# Patient Record
Sex: Female | Born: 1965 | Race: White | Hispanic: No | Marital: Married | Smoking: Never smoker
Health system: Southern US, Community
[De-identification: ages and names within clinical notes are randomized; demographics above are authoritative.]

## PROBLEM LIST (undated history)

## (undated) DIAGNOSIS — Q0700 Arnold-Chiari syndrome without spina bifida or hydrocephalus: Secondary | ICD-10-CM

## (undated) DIAGNOSIS — R51 Headache: Secondary | ICD-10-CM

## (undated) DIAGNOSIS — G4700 Insomnia, unspecified: Secondary | ICD-10-CM

## (undated) DIAGNOSIS — F419 Anxiety disorder, unspecified: Secondary | ICD-10-CM

## (undated) DIAGNOSIS — K219 Gastro-esophageal reflux disease without esophagitis: Secondary | ICD-10-CM

## (undated) HISTORY — DX: Insomnia, unspecified: G47.00

---

## 2005-10-30 ENCOUNTER — Ambulatory Visit (HOSPITAL_COMMUNITY): Admission: RE | Admit: 2005-10-30 | Discharge: 2005-10-30 | Payer: Self-pay | Admitting: Otolaryngology

## 2007-12-30 ENCOUNTER — Other Ambulatory Visit: Admission: RE | Admit: 2007-12-30 | Discharge: 2007-12-30 | Payer: Self-pay | Admitting: Obstetrics and Gynecology

## 2009-05-27 ENCOUNTER — Other Ambulatory Visit: Admission: RE | Admit: 2009-05-27 | Discharge: 2009-05-27 | Payer: Self-pay | Admitting: Obstetrics and Gynecology

## 2010-01-27 ENCOUNTER — Ambulatory Visit (HOSPITAL_COMMUNITY): Admission: RE | Admit: 2010-01-27 | Discharge: 2010-01-27 | Payer: Self-pay | Admitting: Obstetrics and Gynecology

## 2010-07-17 ENCOUNTER — Encounter: Payer: Self-pay | Admitting: Obstetrics and Gynecology

## 2011-05-11 ENCOUNTER — Other Ambulatory Visit: Payer: Self-pay | Admitting: Family Medicine

## 2011-05-11 DIAGNOSIS — R52 Pain, unspecified: Secondary | ICD-10-CM

## 2011-05-16 ENCOUNTER — Ambulatory Visit (HOSPITAL_COMMUNITY)
Admission: RE | Admit: 2011-05-16 | Discharge: 2011-05-16 | Disposition: A | Discharge status: Home / Self Care | Payer: BC Managed Care – PPO | Source: Ambulatory Visit | Attending: Family Medicine | Admitting: Family Medicine

## 2011-05-16 DIAGNOSIS — K8020 Calculus of gallbladder without cholecystitis without obstruction: Secondary | ICD-10-CM | POA: Insufficient documentation

## 2011-05-16 DIAGNOSIS — R1011 Right upper quadrant pain: Secondary | ICD-10-CM | POA: Insufficient documentation

## 2011-05-16 DIAGNOSIS — R52 Pain, unspecified: Secondary | ICD-10-CM

## 2011-06-22 ENCOUNTER — Encounter (HOSPITAL_COMMUNITY): Payer: Self-pay | Admitting: Pharmacy Technician

## 2011-06-28 ENCOUNTER — Other Ambulatory Visit: Payer: Self-pay | Admitting: General Surgery

## 2011-06-29 NOTE — Patient Instructions (Addendum)
20 Baleigh B Alligood  06/29/2011   Your procedure is scheduled on:  07/03/11  Report to [facility] at 9:00 AM.  Call this number if you have problems the morning of surgery: [phone]   Remember:   Do not eat food:After Midnight.  May have clear liquids:until Midnight .  Clear liquids include soda, tea, black coffee, apple or grape juice, broth.  Take these medicines the morning of surgery with A SIP OF WATER: Protonix. Take your Alprazolam and Fioricet only if needed.   Do not wear jewelry, make-up or nail polish.  Do not wear lotions, powders, or perfumes. You may wear deodorant.  Do not shave 48 hours prior to surgery.  Do not bring valuables to the hospital.  Contacts, dentures or bridgework may not be worn into surgery.  Leave suitcase in the car. After surgery it may be brought to your room.  For patients admitted to the hospital, checkout time is 11:00 AM the day of discharge.   Patients discharged the day of surgery will not be allowed to drive home.  Name and phone number of your driver:   Special Instructions: CHG Shower Use Special Wash: 1/2 bottle night before surgery and 1/2 bottle morning of surgery.   Please read over the following fact sheets that you were given: Pain Booklet, MRSA Information, Surgical Site Infection Prevention, Anesthesia Post-op Instructions and Care and Recovery After Surgery    Laparoscopic Cholecystectomy Laparoscopic cholecystectomy is surgery to remove the gallbladder. The gallbladder is located slightly to the right of center in the abdomen, behind the liver. It is a concentrating and storage sac for the bile produced in the liver. Bile aids in the digestion and absorption of fats. Gallbladder disease (cholecystitis) is an inflammation of your gallbladder. This condition is usually caused by a buildup of gallstones (cholelithiasis) in your gallbladder. Gallstones can block the flow of bile, resulting in inflammation and pain. In severe cases, emergency  surgery may be required. When emergency surgery is not required, you will have time to prepare for the procedure. Laparoscopic surgery is an alternative to open surgery. Laparoscopic surgery usually has a shorter recovery time. Your common bile duct may also need to be examined and explored. Your caregiver will discuss this with you if he or she feels this should be done. If stones are found in the common bile duct, they may be removed. LET YOUR CAREGIVER KNOW ABOUT:  Allergies to food or medicine.   Medicines taken, including vitamins, herbs, eyedrops, over-the-counter medicines, and creams.   Use of steroids (by mouth or creams).   Previous problems with anesthetics or numbing medicines.   History of bleeding problems or blood clots.   Previous surgery.   Other health problems, including diabetes and kidney problems.   Possibility of pregnancy, if this applies.  RISKS AND COMPLICATIONS All surgery is associated with risks. Some problems that may occur following this procedure include:  Infection.   Damage to the common bile duct, nerves, arteries, veins, or other internal organs such as the stomach or intestines.   Bleeding.   A stone may remain in the common bile duct.  BEFORE THE PROCEDURE  Do not take aspirin for 3 days prior to surgery or blood thinners for 1 week prior to surgery.   Do not eat or drink anything after midnight the night before surgery.   Let your caregiver know if you develop a cold or other infectious problem prior to surgery.   You should be present  60 minutes before the procedure or as directed.  PROCEDURE  You will be given medicine that makes you sleep (general anesthetic). When you are asleep, your surgeon will make several small cuts (incisions) in your abdomen. One of these incisions is used to insert a small, lighted scope (laparoscope) into the abdomen. The laparoscope helps the surgeon see into your abdomen. Carbon dioxide gas will be pumped  into your abdomen. The gas allows more room for the surgeon to perform your surgery. Other operating instruments are inserted through the other incisions. Laparoscopic procedures may not be appropriate when:  There is major scarring from previous surgery.   The gallbladder is extremely inflamed.   There are bleeding disorders or unexpected cirrhosis of the liver.   A pregnancy is near term.   Other conditions make the laparoscopic procedure impossible.  If your surgeon feels it is not safe to continue with a laparoscopic procedure, he or she will perform an open abdominal procedure. In this case, the surgeon will make an incision to open the abdomen. This gives the surgeon a larger view and field to work within. This may allow the surgeon to perform procedures that sometimes cannot be performed with a laparoscope alone. Open surgery has a longer recovery time. AFTER THE PROCEDURE  You will be taken to the recovery area where a nurse will watch and check your progress.   You may be allowed to go home the same day.   Do not resume physical activities until directed by your caregiver.   You may resume a normal diet and activities as directed.  Document Released: 06/12/2005 Document Revised: 02/22/2011 Document Reviewed: 11/25/2010 [facility] Patient Information 2012 [place], [place].

## 2011-06-30 ENCOUNTER — Encounter (HOSPITAL_COMMUNITY)
Admission: RE | Admit: 2011-06-30 | Discharge: 2011-06-30 | Disposition: A | Discharge status: Home / Self Care | Payer: Managed Care, Other (non HMO) | Source: Ambulatory Visit | Attending: General Surgery | Admitting: General Surgery

## 2011-06-30 ENCOUNTER — Encounter (HOSPITAL_COMMUNITY): Payer: Self-pay

## 2011-06-30 HISTORY — DX: Anxiety disorder, unspecified: F41.9

## 2011-06-30 HISTORY — DX: Gastro-esophageal reflux disease without esophagitis: K21.9

## 2011-06-30 HISTORY — DX: Headache: R51

## 2011-06-30 HISTORY — DX: Arnold-Chiari syndrome without spina bifida or hydrocephalus: Q07.00

## 2011-06-30 LAB — COMPREHENSIVE METABOLIC PANEL
ALT: 15 U/L (ref 0–35)
AST: 15 U/L (ref 0–37)
Albumin: 3.9 g/dL (ref 3.5–5.2)
CO2: 29 mEq/L (ref 19–32)
Glucose, Bld: 111 mg/dL — ABNORMAL HIGH (ref 70–99)
Total Bilirubin: 0.2 mg/dL — ABNORMAL LOW (ref 0.3–1.2)

## 2011-06-30 LAB — SURGICAL PCR SCREEN
MRSA, PCR: NEGATIVE
Staphylococcus aureus: POSITIVE — AB

## 2011-06-30 LAB — CBC
Hemoglobin: 12 g/dL (ref 12.0–15.0)
MCH: 27.4 pg (ref 26.0–34.0)
MCV: 86.5 fL (ref 78.0–100.0)
Platelets: 389 10*3/uL (ref 150–400)
RBC: 4.38 MIL/uL (ref 3.87–5.11)
RDW: 14.2 % (ref 11.5–15.5)

## 2011-06-30 LAB — DIFFERENTIAL
Eosinophils Absolute: 0.1 10*3/uL (ref 0.0–0.7)
Monocytes Absolute: 0.6 10*3/uL (ref 0.1–1.0)
Neutrophils Relative %: 68 % (ref 43–77)

## 2011-06-30 LAB — HCG, SERUM, QUALITATIVE: Preg, Serum: NEGATIVE

## 2011-06-30 NOTE — Consult Note (Signed)
NAME:  Martha Crawford, Martha Crawford NO.:  [id number]  MEDICAL RECORD NO.:  [id number]  LOCATION:  DOIB                          FACILITY:  [facility]  PHYSICIAN:  Barbaraann Barthel, M.D. DATE OF BIRTH:  1965-12-23  DATE OF CONSULTATION:  06/30/2011 DATE OF DISCHARGE:                                CONSULTATION   REFERRING PHYSICIAN:  Donna Bernard, MD  ADMITTING DIAGNOSES:  Cholelithiasis and cholecystitis.  Note, this is a 46 year old white female who had recurrent symptoms of postprandial right upper quadrant pain radiating to her back over the last 2 years.  She did not have significant nausea or vomiting; however, this was a recurrent GI problem that she had along with her symptoms of GERD from time to time.  She had a recent sonogram, which revealed cholelithiasis with echogenic material approximately 3 cm in diameter noted.  She was self-referred from a Dr. Lorin Picket A. Luking's office.  PAST MEDICAL HISTORY:  Fairly unremarkable.  She has a history of anxiety and GERD.  For medications, check the medication chart.  She has no known allergies.  She has no history of tobacco or alcohol use.  PHYSICAL EXAMINATION:  VITAL SIGNS:  She is 5 feet 2 inches, weighs 170 pounds.  Her temperature is 98.2, her heart rate is 64 per minute, respirations 12 per minute, blood pressure 120/80. HEENT:  Head is normocephalic.  Eyes, extraocular movements are intact. Pupils were round and reactive to light and accommodation.  There is no conjunctival pallor or scleral injection.  There is no scleral icterus noted.  Nose and oral mucosa are moist. NECK:  Supple and cylindrical without jugular vein distention, thyromegaly, or tracheal deviation.  There is no adenopathy, and no bruits appreciated. CHEST:  Clear, both anterior and posterior auscultation. HEART:  Regular rhythm. BREASTS AND AXILLA:  Without masses. ABDOMEN:  Soft.  The patient has no localizing tenderness at  the present. There are no masses, and no hernias appreciated. RECTAL:  The patient refused rectal exam at this time. EXTREMITIES:  Within normal limits.  REVIEW OF SYSTEMS:  OB/GYN:  She is a gravida 1, para 1, abortus 0, cesarean 0 female.  She has no family history of breast carcinoma.  Her last mammogram was reported by her as being negative.  This was done in 2012.  GI:  No past history of hepatitis, constipation, diarrhea, bright red rectal bleeding or melena, or history of inflammatory bowel disease or irritable bowel syndrome, and no unexplained weight loss.  The patient does have some symptoms of GERD from time to time.  GU:  No history of dysuria or kidney stones or frequency.  NEURO:  No history of migraines or seizures.  She has been diagnosed with Arnold-Chiari. ENDOCRINE:  No history of endocrine system problems of diabetes or thyroid.  CARDIOPULMONARY:  Within normal limits.  MUSCULOSKELETAL:  The patient is overweight.  PLAN:  We will plan for surgery via the outpatient department.  She has been given a restrictive diet.  We will do this on an elective basis.  I discussed the complications of laparoscopic cholecystectomy with her in detail discussing complications not limited to, but including bleeding,  infection, damage to bile ducts, perforation of organs, and transitory diarrhea, and the possibility of open surgery might be required. Informed consent was obtained.     Barbaraann Barthel, M.D.     WB/MEDQ  D:  06/30/2011  T:  06/30/2011  Job:  [id number]

## 2011-07-03 ENCOUNTER — Ambulatory Visit (HOSPITAL_COMMUNITY)
Admission: RE | Admit: 2011-07-03 | Discharge: 2011-07-04 | Disposition: A | Discharge status: Home / Self Care | Payer: Managed Care, Other (non HMO) | Source: Ambulatory Visit | Attending: General Surgery | Admitting: General Surgery

## 2011-07-03 ENCOUNTER — Encounter (HOSPITAL_COMMUNITY): Payer: Self-pay | Admitting: Anesthesiology

## 2011-07-03 ENCOUNTER — Encounter (HOSPITAL_COMMUNITY)
Admission: RE | Disposition: A | Discharge status: Home / Self Care | Payer: Self-pay | Source: Ambulatory Visit | Attending: General Surgery

## 2011-07-03 ENCOUNTER — Encounter (HOSPITAL_COMMUNITY): Payer: Self-pay | Admitting: *Deleted

## 2011-07-03 ENCOUNTER — Ambulatory Visit (HOSPITAL_COMMUNITY): Payer: Managed Care, Other (non HMO) | Admitting: Anesthesiology

## 2011-07-03 ENCOUNTER — Other Ambulatory Visit: Payer: Self-pay | Admitting: General Surgery

## 2011-07-03 DIAGNOSIS — F411 Generalized anxiety disorder: Secondary | ICD-10-CM | POA: Insufficient documentation

## 2011-07-03 DIAGNOSIS — K8010 Calculus of gallbladder with chronic cholecystitis without obstruction: Secondary | ICD-10-CM

## 2011-07-03 DIAGNOSIS — K8000 Calculus of gallbladder with acute cholecystitis without obstruction: Secondary | ICD-10-CM | POA: Insufficient documentation

## 2011-07-03 HISTORY — PX: CHOLECYSTECTOMY: SHX55

## 2011-07-03 SURGERY — LAPAROSCOPIC CHOLECYSTECTOMY
Anesthesia: General | Site: Abdomen | Wound class: Contaminated

## 2011-07-03 MED ORDER — ACETAMINOPHEN 325 MG PO TABS: 650.0000 mg | ORAL_TABLET | ORAL | Status: DC | PRN

## 2011-07-03 MED ORDER — DEXAMETHASONE SODIUM PHOSPHATE 4 MG/ML IJ SOLN
INTRAMUSCULAR | Status: AC
  Administered 2011-07-03: 4 mg via INTRAVENOUS
  Filled 2011-07-03: qty 1

## 2011-07-03 MED ORDER — PANTOPRAZOLE SODIUM 40 MG PO TBEC
40.0000 mg | DELAYED_RELEASE_TABLET | Freq: Every day | ORAL | Status: DC
  Administered 2011-07-04: 40 mg via ORAL
  Filled 2011-07-03 (×3): qty 1

## 2011-07-03 MED ORDER — FENTANYL CITRATE 0.05 MG/ML IJ SOLN
INTRAMUSCULAR | Status: AC
  Administered 2011-07-03: 50 ug via INTRAVENOUS
  Filled 2011-07-03: qty 2

## 2011-07-03 MED ORDER — ONDANSETRON HCL 4 MG/2ML IJ SOLN
INTRAMUSCULAR | Status: AC
  Administered 2011-07-03: 4 mg via INTRAVENOUS
  Filled 2011-07-03: qty 2

## 2011-07-03 MED ORDER — MORPHINE SULFATE 2 MG/ML IJ SOLN
1.0000 mg | INTRAMUSCULAR | Status: DC | PRN
  Administered 2011-07-03 – 2011-07-04 (×5): 1 mg via INTRAVENOUS
  Filled 2011-07-03 (×4): qty 1

## 2011-07-03 MED ORDER — DEXTROSE 5 % IV SOLN: 1.0000 g | Freq: Once | INTRAVENOUS | Status: DC

## 2011-07-03 MED ORDER — ROCURONIUM BROMIDE 50 MG/5ML IV SOLN
INTRAVENOUS | Status: AC
  Filled 2011-07-03: qty 1

## 2011-07-03 MED ORDER — DEXTROSE 5 % IV SOLN
1.0000 g | INTRAVENOUS | Status: DC | PRN
  Administered 2011-07-03: 1 g via INTRAVENOUS

## 2011-07-03 MED ORDER — GLYCOPYRROLATE 0.2 MG/ML IJ SOLN
INTRAMUSCULAR | Status: DC | PRN
  Administered 2011-07-03: .4 mg via INTRAVENOUS

## 2011-07-03 MED ORDER — FENTANYL CITRATE 0.05 MG/ML IJ SOLN
25.0000 ug | INTRAMUSCULAR | Status: DC | PRN
  Administered 2011-07-03 (×4): 50 ug via INTRAVENOUS

## 2011-07-03 MED ORDER — MIDAZOLAM HCL 5 MG/5ML IJ SOLN
INTRAMUSCULAR | Status: DC | PRN
  Administered 2011-07-03: 2 mg via INTRAVENOUS

## 2011-07-03 MED ORDER — ONDANSETRON HCL 4 MG/2ML IJ SOLN
4.0000 mg | Freq: Once | INTRAMUSCULAR | Status: AC
  Administered 2011-07-03: 4 mg via INTRAVENOUS

## 2011-07-03 MED ORDER — GLYCOPYRROLATE 0.2 MG/ML IJ SOLN
INTRAMUSCULAR | Status: AC
  Filled 2011-07-03: qty 2

## 2011-07-03 MED ORDER — ALPRAZOLAM 0.5 MG PO TABS
0.5000 mg | ORAL_TABLET | Freq: Every day | ORAL | Status: DC | PRN
  Administered 2011-07-03 – 2011-07-04 (×2): 0.5 mg via ORAL
  Filled 2011-07-03: qty 1

## 2011-07-03 MED ORDER — MORPHINE SULFATE 4 MG/ML IJ SOLN
INTRAMUSCULAR | Status: AC
  Filled 2011-07-03: qty 1

## 2011-07-03 MED ORDER — MIDAZOLAM HCL 2 MG/2ML IJ SOLN
INTRAMUSCULAR | Status: AC
  Filled 2011-07-03: qty 2

## 2011-07-03 MED ORDER — WATER FOR IRRIGATION, STERILE IR SOLN
Status: DC | PRN
  Administered 2011-07-03: 2000 mL

## 2011-07-03 MED ORDER — DEXAMETHASONE SODIUM PHOSPHATE 4 MG/ML IJ SOLN
4.0000 mg | Freq: Once | INTRAMUSCULAR | Status: AC
  Administered 2011-07-03: 4 mg via INTRAVENOUS

## 2011-07-03 MED ORDER — MIDAZOLAM HCL 2 MG/2ML IJ SOLN
INTRAMUSCULAR | Status: AC
  Administered 2011-07-03: 2 mg via INTRAVENOUS
  Filled 2011-07-03: qty 2

## 2011-07-03 MED ORDER — BUPIVACAINE HCL (PF) 0.5 % IJ SOLN
INTRAMUSCULAR | Status: DC | PRN
  Administered 2011-07-03: 10 mL
  Administered 2011-07-03: 3 mL

## 2011-07-03 MED ORDER — POTASSIUM CHLORIDE IN NACL 20-0.9 MEQ/L-% IV SOLN
INTRAVENOUS | Status: DC
  Administered 2011-07-03: 125 mL/h via INTRAVENOUS
  Administered 2011-07-04 (×2): via INTRAVENOUS
  Filled 2011-07-03 (×6): qty 1000

## 2011-07-03 MED ORDER — SCOPOLAMINE 1 MG/3DAYS TD PT72
1.0000 | MEDICATED_PATCH | Freq: Once | TRANSDERMAL | Status: DC
  Administered 2011-07-03: 1.5 mg via TRANSDERMAL

## 2011-07-03 MED ORDER — PROPOFOL 10 MG/ML IV BOLUS
INTRAVENOUS | Status: DC | PRN
  Administered 2011-07-03: 150 mg via INTRAVENOUS

## 2011-07-03 MED ORDER — ONDANSETRON HCL 4 MG/2ML IJ SOLN
4.0000 mg | Freq: Four times a day (QID) | INTRAMUSCULAR | Status: DC | PRN
  Administered 2011-07-03 – 2011-07-04 (×2): 4 mg via INTRAVENOUS
  Filled 2011-07-03 (×2): qty 2

## 2011-07-03 MED ORDER — LIDOCAINE HCL (PF) 1 % IJ SOLN
INTRAMUSCULAR | Status: AC
  Filled 2011-07-03: qty 5

## 2011-07-03 MED ORDER — LACTATED RINGERS IV SOLN
INTRAVENOUS | Status: DC
  Administered 2011-07-03: 12:00:00 via INTRAVENOUS
  Administered 2011-07-03: 1000 mL via INTRAVENOUS

## 2011-07-03 MED ORDER — LIDOCAINE HCL 1 % IJ SOLN
INTRAMUSCULAR | Status: DC | PRN
  Administered 2011-07-03: 40 mg via INTRADERMAL

## 2011-07-03 MED ORDER — ONDANSETRON HCL 4 MG/2ML IJ SOLN
INTRAMUSCULAR | Status: AC
  Filled 2011-07-03: qty 2

## 2011-07-03 MED ORDER — SODIUM CHLORIDE 0.9 % IR SOLN
Status: DC | PRN
  Administered 2011-07-03: 1000 mL

## 2011-07-03 MED ORDER — SCOPOLAMINE 1 MG/3DAYS TD PT72
MEDICATED_PATCH | TRANSDERMAL | Status: AC
  Administered 2011-07-03: 1.5 mg via TRANSDERMAL
  Filled 2011-07-03: qty 1

## 2011-07-03 MED ORDER — ONDANSETRON HCL 4 MG PO TABS
4.0000 mg | ORAL_TABLET | Freq: Four times a day (QID) | ORAL | Status: DC | PRN
  Filled 2011-07-03: qty 1

## 2011-07-03 MED ORDER — NEOSTIGMINE METHYLSULFATE 1 MG/ML IJ SOLN
INTRAMUSCULAR | Status: DC | PRN
  Administered 2011-07-03: 2 mg via INTRAVENOUS

## 2011-07-03 MED ORDER — FENTANYL CITRATE 0.05 MG/ML IJ SOLN
INTRAMUSCULAR | Status: AC
  Administered 2011-07-03: 50 ug via INTRAVENOUS
  Filled 2011-07-03: qty 5

## 2011-07-03 MED ORDER — FENTANYL CITRATE 0.05 MG/ML IJ SOLN
INTRAMUSCULAR | Status: DC | PRN
  Administered 2011-07-03 (×5): 50 ug via INTRAVENOUS

## 2011-07-03 MED ORDER — DEXTROSE 5 % IV SOLN
INTRAVENOUS | Status: AC
  Filled 2011-07-03: qty 10

## 2011-07-03 MED ORDER — PROPOFOL 10 MG/ML IV EMUL
INTRAVENOUS | Status: AC
  Filled 2011-07-03: qty 20

## 2011-07-03 MED ORDER — NEOSTIGMINE METHYLSULFATE 1 MG/ML IJ SOLN
INTRAMUSCULAR | Status: AC
  Filled 2011-07-03: qty 10

## 2011-07-03 MED ORDER — ONDANSETRON HCL 4 MG/2ML IJ SOLN
4.0000 mg | Freq: Once | INTRAMUSCULAR | Status: AC | PRN
  Administered 2011-07-03: 4 mg via INTRAVENOUS

## 2011-07-03 MED ORDER — ROCURONIUM BROMIDE 100 MG/10ML IV SOLN
INTRAVENOUS | Status: DC | PRN
  Administered 2011-07-03: 10 mg via INTRAVENOUS
  Administered 2011-07-03: 30 mg via INTRAVENOUS

## 2011-07-03 MED ORDER — BUPIVACAINE HCL (PF) 0.5 % IJ SOLN
INTRAMUSCULAR | Status: AC
  Filled 2011-07-03: qty 30

## 2011-07-03 MED ORDER — MIDAZOLAM HCL 2 MG/2ML IJ SOLN
1.0000 mg | INTRAMUSCULAR | Status: DC | PRN
  Administered 2011-07-03 (×2): 2 mg via INTRAVENOUS

## 2011-07-03 SURGICAL SUPPLY — 66 items
APPLICATOR COTTON TIP 6IN STRL (MISCELLANEOUS) ×2 IMPLANT
APPLIER CLIP ROT 10 11.4 M/L (STAPLE) ×2
ATTRACTOMAT 16X20 MAGNETIC DRP (DRAPES) IMPLANT
BAG HAMPER (MISCELLANEOUS) ×2 IMPLANT
BLADE SURG 15 STRL LF DISP TIS (BLADE) ×1 IMPLANT
BLADE SURG 15 STRL SS (BLADE) ×1
BLADE SURG SZ10 CARB STEEL (BLADE) IMPLANT
CLIP APPLIE ROT 10 11.4 M/L (STAPLE) ×1 IMPLANT
CLOTH BEACON ORANGE TIMEOUT ST (SAFETY) ×2 IMPLANT
COVER LIGHT HANDLE STERIS (MISCELLANEOUS) ×4 IMPLANT
DECANTER SPIKE VIAL GLASS SM (MISCELLANEOUS) ×2 IMPLANT
DISSECTOR BLUNT TIP ENDO 5MM (MISCELLANEOUS) ×2 IMPLANT
DRAPE WARM FLUID 44X44 (DRAPE) IMPLANT
DRSG GAUZE PETRO 3X36 STRIP (GAUZE/BANDAGES/DRESSINGS) ×2 IMPLANT
DRSG TEGADERM 2-3/8X2-3/4 SM (GAUZE/BANDAGES/DRESSINGS) ×6 IMPLANT
ELECT BLADE 6 FLAT ULTRCLN (ELECTRODE) IMPLANT
ELECT REM PT RETURN 9FT ADLT (ELECTROSURGICAL) ×2
ELECTRODE REM PT RTRN 9FT ADLT (ELECTROSURGICAL) ×1 IMPLANT
EVACUATOR DRAINAGE 10X20 100CC (DRAIN) ×1 IMPLANT
EVACUATOR SILICONE 100CC (DRAIN) ×1
FILTER SMOKE EVAC LAPAROSHD (FILTER) ×2 IMPLANT
FORMALIN 10 PREFIL 120ML (MISCELLANEOUS) ×2 IMPLANT
GLOVE ECLIPSE 6.5 STRL STRAW (GLOVE) ×2 IMPLANT
GLOVE ECLIPSE 7.0 STRL STRAW (GLOVE) ×2 IMPLANT
GLOVE EXAM NITRILE MD LF STRL (GLOVE) ×2 IMPLANT
GLOVE INDICATOR 7.0 STRL GRN (GLOVE) ×2 IMPLANT
GLOVE INDICATOR 7.5 STRL GRN (GLOVE) ×2 IMPLANT
GLOVE SKINSENSE NS SZ7.0 (GLOVE) ×2
GLOVE SKINSENSE STRL SZ7.0 (GLOVE) ×2 IMPLANT
GOWN STRL REIN XL XLG (GOWN DISPOSABLE) ×6 IMPLANT
HEMOSTAT SNOW SURGICEL 2X4 (HEMOSTASIS) ×2 IMPLANT
HEMOSTAT SURGICEL 4X8 (HEMOSTASIS) IMPLANT
INST SET LAPROSCOPIC AP (KITS) ×2 IMPLANT
IV NS IRRIG 3000ML ARTHROMATIC (IV SOLUTION) ×2 IMPLANT
KIT ROOM TURNOVER APOR (KITS) ×2 IMPLANT
KIT TROCAR LAP CHOLE (TROCAR) ×2 IMPLANT
MANIFOLD NEPTUNE II (INSTRUMENTS) ×2 IMPLANT
NS IRRIG 1000ML POUR BTL (IV SOLUTION) ×2 IMPLANT
PACK LAP CHOLE LZT030E (CUSTOM PROCEDURE TRAY) ×2 IMPLANT
PAD ARMBOARD 7.5X6 YLW CONV (MISCELLANEOUS) ×2 IMPLANT
PENCIL HANDSWITCHING (ELECTRODE) IMPLANT
POUCH SPECIMEN RETRIEVAL 10MM (ENDOMECHANICALS) ×2 IMPLANT
SET BASIN LINEN APH (SET/KITS/TRAYS/PACK) ×2 IMPLANT
SET TUBE IRRIG SUCTION NO TIP (IRRIGATION / IRRIGATOR) ×2 IMPLANT
SOL PREP PROV IODINE SCRUB 4OZ (MISCELLANEOUS) ×2 IMPLANT
SPONGE DRAIN TRACH 4X4 STRL 2S (GAUZE/BANDAGES/DRESSINGS) ×2 IMPLANT
SPONGE GAUZE 4X4 12PLY (GAUZE/BANDAGES/DRESSINGS) ×2 IMPLANT
SPONGE INTESTINAL PEANUT (DISPOSABLE) IMPLANT
SPONGE LAP 18X18 X RAY DECT (DISPOSABLE) IMPLANT
STAPLER VISISTAT 35W (STAPLE) ×2 IMPLANT
SUT ETHILON 3 0 FSL (SUTURE) ×2 IMPLANT
SUT SILK 2 0 (SUTURE)
SUT SILK 2 0 SH (SUTURE) IMPLANT
SUT SILK 2-0 18XBRD TIE 12 (SUTURE) IMPLANT
SUT SILK 3 0 SH CR/8 (SUTURE) IMPLANT
SUT VIC AB 0 CT1 27 (SUTURE)
SUT VIC AB 0 CT1 27XBRD ANTBC (SUTURE) IMPLANT
SUT VIC AB 0 CT1 27XCR 8 STRN (SUTURE) IMPLANT
SUT VICRYL 0 UR6 27IN ABS (SUTURE) ×4 IMPLANT
SYR BULB IRRIGATION 50ML (SYRINGE) IMPLANT
TAPE CLOTH SURG 4X10 WHT LF (GAUZE/BANDAGES/DRESSINGS) ×2 IMPLANT
TOWEL OR 17X26 4PK STRL BLUE (TOWEL DISPOSABLE) ×2 IMPLANT
TRAY FOLEY CATH 14FR (SET/KITS/TRAYS/PACK) ×2 IMPLANT
WARMER LAPAROSCOPE (MISCELLANEOUS) ×2 IMPLANT
WATER STERILE IRR 1000ML POUR (IV SOLUTION) ×4 IMPLANT
YANKAUER SUCT BULB TIP 10FT TU (MISCELLANEOUS) IMPLANT

## 2011-07-03 NOTE — Anesthesia Preprocedure Evaluation (Signed)
Anesthesia Evaluation  Patient identified by MRN, date of birth, ID band Patient awake    Reviewed: Allergy & Precautions, H&P , NPO status , Patient's Chart, lab work & pertinent test results  Airway Mallampati: I TM Distance: >3 FB     Dental  (+) Teeth Intact   Pulmonary  clear to auscultation        Cardiovascular neg cardio ROS Regular Normal    Neuro/Psych  Headaches (hx mild Arnold-Chiari Malformation, occas headaches.), Anxiety    GI/Hepatic GERD-  Medicated and Controlled,  Endo/Other    Renal/GU      Musculoskeletal   Abdominal   Peds  Hematology   Anesthesia Other Findings   Reproductive/Obstetrics                           Anesthesia Physical Anesthesia Plan  ASA: II  Anesthesia Plan: General   Post-op Pain Management:    Induction: Intravenous, Rapid sequence and Cricoid pressure planned  Airway Management Planned:   Additional Equipment:   Intra-op Plan:   Post-operative Plan: Extubation in OR  Informed Consent: I have reviewed the patients History and Physical, chart, labs and discussed the procedure including the risks, benefits and alternatives for the proposed anesthesia with the patient or authorized representative who has indicated his/her understanding and acceptance.     Plan Discussed with:   Anesthesia Plan Comments:         Anesthesia Quick Evaluation

## 2011-07-03 NOTE — Op Note (Signed)
NAMEMarland Kitchen  Martha Crawford, Martha Crawford NO.:  [id number]  MEDICAL RECORD NO.:  [id number]  LOCATION:  A206                          FACILITY:  [facility]  PHYSICIAN:  Barbaraann Barthel, M.D. DATE OF BIRTH:  07/27/1965  DATE OF PROCEDURE:  07/03/2011 DATE OF DISCHARGE:                              OPERATIVE REPORT   DIAGNOSIS:  Acute cholecystitis, cholelithiasis.  PROCEDURE:  Laparoscopic cholecystectomy.  SPECIMEN:  Gallbladder with stones.  WOUND CLASSIFICATION:  Contaminated.  NOTE:  This is a 46 year old white female who had episodes of recurrent right upper quadrant postprandial pain, without much nausea and vomiting, but these symptoms were for over a year, almost 2 years.  She was self-referred from Dr. Fanny Dance office.  She had a sonogram which revealed cholelithiasis.  Her liver function studies preoperatively were grossly within normal limits.  Her pancreatic enzymes were not elevated.  We discussed the need for cholecystectomy and I discussed complications not limited to, but including bleeding, infection, and damage to bile ducts, perforation of organs, and transitory diarrhea, and the possibility that open surgery might be required if laparoscopic cholecystectomy was deemed hazardous at that time.  Informed consent was obtained.  GROSS OPERATIVE FINDINGS:  The patient had an acutely inflamed gallbladder, looked like acute and chronic inflammation actually.  It was distended with grayish fluid which we had to a insert a Weck needle to remove approximately 40 mL, so we can adequately grasp the gallbladder for dissection.  The right upper quadrant otherwise appeared to be normal.  This cystic duct was not dilated.  TECHNIQUE:  The patient was placed in supine position and after the adequate administration of general anesthesia via endotracheal intubation, her entire abdomen was prepped with Betadine solution and draped in usual manner.  Foley catheter was  aseptically inserted and a periumbilical incision was carried out over the superior aspect of the umbilicus down to the fascia which was grasped with a sharp towel clip and elevated.  The Veress needle was inserted and confirmed in position with a saline drop test.  We then used Visiport technique in the umbilicus and then under direct vision, a 11-mm cannula was placed in the epigastrium with 2.5 mm cannulas placed in the right upper quadrant laterally.  The gallbladder was visualized.  It was distended.  We were unable to grasp it, so with careful direct vision, we inserted a Weck needle through the lateral port and aspirated approximately 40 mL of grayish fluid from the gallbladder.  This enabled [place] to be able to grasp the gallbladder, without having the graspers slip off of the gallbladder.  We then grasped the fundus over the area of the needle insertion to prevent any leakage and then grasped the distal portion near Hartmann's pouch, and then I dissected freely and easily visualized the cystic duct.  A picture was taken of this, this was triply silver clipped and divided.  Cystic artery was also doubly clipped, with a silver clip and divided.  The gallbladder was removed from the liver bed uneventfully with a hook cautery device.  There was no spillage.  We then removed the gallbladder using the EndoCatch device exiting it through the  epigastric incision and we had to crush the stones in the gallbladder in order to remove this from the port site incision.  Once this was done, we then reinserted the cannulas and checked for hemostasis and irrigated carefully.  I placed some hemostatic material in the liver bed as well as a Al Pimple drain which exited through one of the lateral most port sites.  The abdomen was then desufflated and we then closed the port sites using 0 Polysorb in the area of the epigastrium and the umbilicus and then closed all skin incisions with a stapling  device.  We used 0.5% Sensorcaine a total of 13 mL around the port sites for postoperative comfort.  The drain was sutured in place with 3-0 nylon.  Prior to closure, all sponge, needle, and instrument counts found to be correct.  Estimated blood loss was minimal, less than 25 mL.  The patient received a liter of crystalloids intraoperatively. There were no complications.     Barbaraann Barthel, M.D.     WB/MEDQ  D:  07/03/2011  T:  07/03/2011  Job:  [id number]  cc:   Lorin Picket A. Gerda Diss, MD Fax: [phone]

## 2011-07-03 NOTE — Progress Notes (Signed)
Patient stable just waiting bed placement.

## 2011-07-03 NOTE — Anesthesia Postprocedure Evaluation (Signed)
  Anesthesia Post-op Note  Patient: Martha Crawford  Procedure(s) Performed:  LAPAROSCOPIC CHOLECYSTECTOMY  Patient Location: PACU  Anesthesia Type: General  Level of Consciousness: awake, alert  and oriented  Airway and Oxygen Therapy: Patient Spontanous Breathing and Patient connected to face mask oxygen  Post-op Pain: mild  Post-op Assessment: Post-op Vital signs reviewed, Patient's Cardiovascular Status Stable, Respiratory Function Stable and No signs of Nausea or vomiting  Post-op Vital Signs: Reviewed and stable  Complications: No apparent anesthesia complications

## 2011-07-03 NOTE — Progress Notes (Signed)
JP drain intact. Drainage watery pink colored.

## 2011-07-03 NOTE — Brief Op Note (Signed)
07/03/2011  12:36 PM  PATIENT:  Martha Crawford  46 y.o. female  PRE-OPERATIVE DIAGNOSIS:  cholelithiasis  POST-OPERATIVE DIAGNOSIS:  cholelithiasis, acute cholecystitis  PROCEDURE:  Procedure(s): LAPAROSCOPIC CHOLECYSTECTOMY  SURGEON:  Surgeon(s): Marlane Hatcher  PHYSICIAN ASSISTANT:   ASSISTANTS: none   ANESTHESIA:   general  EBL:  Total I/O In: 1100 [I.V.:1100] Out: 350 [Urine:325; Blood:25]  BLOOD ADMINISTERED:none  DRAINS: (one) Jackson-Pratt drain(s) with closed bulb suction in the placed in liver bed.   LOCAL MEDICATIONS USED:  MARCAINE 13 CC  SPECIMEN:  Source of Specimen:  gall bladder and stones and gallbladder aspirate.  DISPOSITION OF SPECIMEN:  PATHOLOGY  COUNTS:  YES  TOURNIQUET:  * No tourniquets in log *  DICTATION: .Other Dictation: Dictation Number OR Dict.# [id number]  PLAN OF CARE: Admit for overnight observation  PATIENT DISPOSITION:  PACU - hemodynamically stable.   Delay start of Pharmacological VTE agent (>24hrs) due to surgical blood loss or risk of bleeding:  {YES/NO/NOT APPLICABLE:20182

## 2011-07-03 NOTE — Anesthesia Procedure Notes (Signed)
Procedure Name: Intubation Date/Time: 07/03/2011 10:59 AM Performed by: Glynn Octave Pre-anesthesia Checklist: Patient identified, Patient being monitored, Timeout performed, Emergency Drugs available and Suction available Patient Re-evaluated:Patient Re-evaluated prior to inductionOxygen Delivery Method: Circle System Utilized Preoxygenation: Pre-oxygenation with 100% oxygen Intubation Type: IV induction and Cricoid Pressure applied Ventilation: Mask ventilation without difficulty Laryngoscope Size: Mac and 3 Grade View: Grade II Tube type: Oral Tube size: 7.0 mm Number of attempts: 1 Airway Equipment and Method: stylet Placement Confirmation: ETT inserted through vocal cords under direct vision,  positive ETCO2 and breath sounds checked- equal and bilateral Secured at: 21 cm Tube secured with: Tape Dental Injury: Teeth and Oropharynx as per pre-operative assessment

## 2011-07-03 NOTE — Transfer of Care (Signed)
Immediate Anesthesia Transfer of Care Note  Patient: Martha Crawford  Procedure(s) Performed:  LAPAROSCOPIC CHOLECYSTECTOMY  Patient Location: PACU  Anesthesia Type: General  Level of Consciousness: awake, alert  and oriented  Airway & Oxygen Therapy: Patient Spontanous Breathing and Patient connected to face mask oxygen  Post-op Assessment: Report given to PACU RN  Post vital signs: Reviewed and stable  Complications: No apparent anesthesia complications

## 2011-07-03 NOTE — Progress Notes (Signed)
Post OP Check  Pt. Awake and alert.  Has post op anxiety which is not unusual for her. Dressings clean and dry; mi. JP serosanguinous drainage.  Pt has voided and tolerated some clears.  Xanax will be given  Filed Vitals:   07/03/11 1603  BP: 144/78  Pulse: 108  Temp:   Resp: 22   Discharge in AM.

## 2011-07-03 NOTE — Progress Notes (Signed)
Awake. JP drain in place to abd and activated. Draining clear red drainage. Transferred to 206 in good condition.

## 2011-07-03 NOTE — Progress Notes (Signed)
H&P dictation #: [id number]  No clinical change since H&P.  Procedure and risked addressed and all questions answered and informed consent obtained.  Filed Vitals:   07/03/11 1025  BP: 138/82  Pulse:   Temp:   Resp: 23    Labs reviewed; for surgery.

## 2011-07-03 NOTE — Progress Notes (Signed)
Took over care from [place], RN at this time.  States 8/10 abdominal pain.  No acute distress noted.  Unable to give additional pain medicine at this time due to time frame.  Reather Laurence, RN

## 2011-07-04 ENCOUNTER — Encounter (HOSPITAL_COMMUNITY): Payer: Self-pay | Admitting: *Deleted

## 2011-07-04 LAB — BASIC METABOLIC PANEL
BUN: 4 mg/dL — ABNORMAL LOW (ref 6–23)
Chloride: 109 mEq/L (ref 96–112)
GFR calc Af Amer: >90 mL/min (ref >90)
GFR calc non Af Amer: >90 mL/min (ref >90)
Potassium: 3.7 mEq/L (ref 3.5–5.1)
Sodium: 139 mEq/L (ref 135–145)

## 2011-07-04 LAB — CBC
HCT: 32.3 % — ABNORMAL LOW (ref 36.0–46.0)
MCHC: 32.5 g/dL (ref 30.0–36.0)
Platelets: 317 10*3/uL (ref 150–400)
RDW: 14.5 % (ref 11.5–15.5)
WBC: 13 10*3/uL — ABNORMAL HIGH (ref 4.0–10.5)

## 2011-07-04 LAB — HEPATIC FUNCTION PANEL
ALT: 30 U/L (ref 0–35)
AST: 32 U/L (ref 0–37)
Albumin: 3.3 g/dL — ABNORMAL LOW (ref 3.5–5.2)
Bilirubin, Direct: 0.1 mg/dL (ref 0.0–0.3)
Total Protein: 6.7 g/dL (ref 6.0–8.3)

## 2011-07-04 MED ORDER — OXYCODONE-ACETAMINOPHEN 5-325 MG PO TABS: 1.0000 | ORAL_TABLET | ORAL | Status: AC | PRN

## 2011-07-04 NOTE — Progress Notes (Signed)
POD #1  Pt with very low pain tolerance but abdomen is soft.  JP drainage is about 25 cc sero sanguinous drainage.  Dressings clean and dry and changed and drain removed. LFS are normal.  Voiding well no Shortness of Breath or chest pain or leg pain. Pt will be discharged and instructions given.  She needs to ambulate and this was explained to her husband.  Filed Vitals:   07/04/11 0602  BP: 133/87  Pulse: 109  Temp: 99 F (37.2 C)  Resp: 18

## 2011-07-04 NOTE — Addendum Note (Signed)
Addendum  created 07/04/11 1150 by Sawsan Riggio, CRNA   Modules edited:Notes Section    

## 2011-07-04 NOTE — Discharge Summary (Signed)
NAMEMarland Kitchen  Martha Crawford, Martha Crawford NO.:  [id number]  MEDICAL RECORD NO.:  [id number]  LOCATION:  A206                          FACILITY:  [facility]  PHYSICIAN:  Barbaraann Barthel, M.D. DATE OF BIRTH:  1965/12/08  DATE OF ADMISSION:  07/03/2011 DATE OF DISCHARGE:  01/08/2013LH                              DISCHARGE SUMMARY   DIAGNOSIS:  Acute cholecystitis with cholelithiasis.  PROCEDURE:  Laparoscopic cholecystectomy on July 03, 2011.  NOTE:  This is a 46 year old white female who was self-referred from Dr. Lorin Picket Luking's office and was found to have cholecystitis, cholelithiasis, and she was treated via the outpatient department by laparoscopic cholecystectomy on July 03, 2011.  Postoperatively, her course was completely uneventful, and she tolerated the procedure well.  At the time of discharge, her wounds were clean.  She had no leg pain, shortness of breath, or dysuria, and she had minimal Jackson-Pratt serosanguineous drainage, and the drain was removed just prior to her leaving.  Her postoperative liver function studies were all within normal limits.  Discharge instructions were given to her.  She was given a prescription for Percocet for pain, and she is told to resume her other medications, but avoid taking any aspirin products at the present time.  We will follow up with her perioperatively after which she is to return to Dr. Gerda Diss for any medical problems she may have in the future.     Barbaraann Barthel, M.D.     WB/MEDQ  D:  07/04/2011  T:  07/04/2011  Job:  [id number]  cc:   Lorin Picket A. Gerda Diss, MD Fax: [phone]

## 2011-07-04 NOTE — Anesthesia Postprocedure Evaluation (Signed)
Anesthesia Post Note  Patient: Martha Crawford  Procedure(s) Performed:  LAPAROSCOPIC CHOLECYSTECTOMY  Anesthesia type: General  Patient location: 206  Post pain: Pain level controlled  Post assessment: Post-op Vital signs reviewed, Patient's Cardiovascular Status Stable, Respiratory Function Stable, Patent Airway, No signs of Nausea or vomiting and Pain level controlled  Last Vitals:  Filed Vitals:   07/04/11 0602  BP: 133/87  Pulse: 109  Temp: 37.2 C  Resp: 18    Post vital signs: Reviewed and stable  Level of consciousness: awake and alert   Complications: No apparent anesthesia complications

## 2011-07-04 NOTE — Progress Notes (Signed)
Pt discharge instructions given. Pt verbalized understanding of discharge instructions. Pt accompanied to awaiting vehicle via wheelchair. Pt stable on discharge.

## 2011-07-04 NOTE — Discharge Summary (Signed)
  Discharge dict. # [id number].

## 2011-07-06 ENCOUNTER — Encounter (HOSPITAL_COMMUNITY): Payer: Self-pay | Admitting: General Surgery

## 2012-09-11 ENCOUNTER — Other Ambulatory Visit: Payer: Self-pay

## 2012-09-11 MED ORDER — PANTOPRAZOLE SODIUM 40 MG PO TBEC: 40.0000 mg | DELAYED_RELEASE_TABLET | Freq: Every day | ORAL | Status: DC

## 2012-10-05 ENCOUNTER — Encounter: Payer: Self-pay | Admitting: *Deleted

## 2012-10-15 ENCOUNTER — Other Ambulatory Visit: Payer: Self-pay | Admitting: Family Medicine

## 2012-12-17 ENCOUNTER — Other Ambulatory Visit: Payer: Self-pay | Admitting: Family Medicine

## 2012-12-17 NOTE — Telephone Encounter (Signed)
Needs office visit.

## 2013-01-21 ENCOUNTER — Other Ambulatory Visit: Payer: Self-pay | Admitting: Family Medicine

## 2013-02-26 ENCOUNTER — Encounter: Payer: Self-pay | Admitting: Family Medicine

## 2013-02-26 ENCOUNTER — Ambulatory Visit (INDEPENDENT_AMBULATORY_CARE_PROVIDER_SITE_OTHER): Payer: Managed Care, Other (non HMO) | Admitting: Family Medicine

## 2013-02-26 VITALS — BP 120/84 | Ht 62.0 in | Wt 175.5 lb

## 2013-02-26 DIAGNOSIS — G4700 Insomnia, unspecified: Secondary | ICD-10-CM | POA: Insufficient documentation

## 2013-02-26 DIAGNOSIS — F411 Generalized anxiety disorder: Secondary | ICD-10-CM

## 2013-02-26 DIAGNOSIS — K219 Gastro-esophageal reflux disease without esophagitis: Secondary | ICD-10-CM | POA: Insufficient documentation

## 2013-02-26 DIAGNOSIS — G44209 Tension-type headache, unspecified, not intractable: Secondary | ICD-10-CM

## 2013-02-26 MED ORDER — BUTALBITAL-APAP-CAFFEINE 50-325-40 MG PO TABS: ORAL_TABLET | ORAL | Status: DC

## 2013-02-26 MED ORDER — PANTOPRAZOLE SODIUM 40 MG PO TBEC: 40.0000 mg | DELAYED_RELEASE_TABLET | Freq: Every day | ORAL | Status: DC

## 2013-02-26 MED ORDER — ALPRAZOLAM 0.25 MG PO TABS: 0.2500 mg | ORAL_TABLET | Freq: Every evening | ORAL | Status: DC | PRN

## 2013-02-26 NOTE — Progress Notes (Signed)
  Subjective:    Patient ID: Martha Crawford, female    DOB: 05-Apr-1966, 47 y.o.   MRN: [id number]  Anxiety Presents for follow-up visit. Symptoms include nervous/anxious behavior. Primary symptoms comment: headaches. The severity of symptoms is mild. The symptoms are aggravated by work stress. The quality of sleep is poor.   There are no known risk factors. Treatments tried: xanax. The treatment provided moderate relief. Compliance with prior treatments has been good.  Patient states she has no other concerns.  Headaches also somewhat worse, weather seems to add to this, took something month of July  Exercise--does zumba off and on,  Overall pretty good, now requiring more meds for anxiety, and stress with son. 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 Harrietta Guardian is asenior, homeschooled since 8th grade. Mo gets stressed every year.   Review of Systems  Psychiatric/Behavioral: The patient is nervous/anxious.    No chest pain no abdominal pain no shortness of breath. Definite reflux if she does not take her medication.    Objective:   Physical Exam  Alert no acute distress. HEENT normal. Lungs clear. Heart regular in rhythm. Neuro exam intact. Abdominal exam benign      Assessment & Plan:   impression 1 reflux clinically stable. #2 insomnia controlled well by nightly anxiety. #3 headaches tension in nature aggravated by stress. Plan maintain all medicines. Diet exercise discussed in encourage. Medications refilled. Recommend checking every 6 months. Claims no overt depression at this time. WSL

## 2013-02-26 NOTE — Patient Instructions (Signed)
Try to exercise regularly

## 2013-07-29 ENCOUNTER — Ambulatory Visit (INDEPENDENT_AMBULATORY_CARE_PROVIDER_SITE_OTHER): Payer: Managed Care, Other (non HMO) | Admitting: Family Medicine

## 2013-07-29 ENCOUNTER — Encounter: Payer: Self-pay | Admitting: Family Medicine

## 2013-07-29 VITALS — BP 110/70 | Temp 99.0°F | Ht 62.0 in | Wt 182.5 lb

## 2013-07-29 DIAGNOSIS — J329 Chronic sinusitis, unspecified: Secondary | ICD-10-CM

## 2013-07-29 MED ORDER — AZITHROMYCIN 250 MG PO TABS: ORAL_TABLET | ORAL | Status: DC

## 2013-07-29 NOTE — Progress Notes (Signed)
   Subjective:    Patient ID: Martha Crawford, female    DOB: 1966-01-19, 48 y.o.   MRN: [id number]  Sore Throat  This is a new problem. The current episode started in the past 7 days. The problem has been unchanged. Neither side of throat is experiencing more pain than the other. There has been no fever. The pain is at a severity of 5/10. The pain is moderate. Associated symptoms include ear pain. Treatments tried: cough drops. The treatment provided significant relief.   Left ear aches  Off an on  Throat uncomfortable and  Clearing throat,  Felt feve  Bad sore throat, has stuff sched for firca    Review of Systems  HENT: Positive for ear pain.    no vomiting no diarrhea no rash ROS otherwise negative     Objective:   Physical Exam  Alert mild malaise. Hydration good. Vital stable. HEENT frontal maxillary tenderness pharynx erythematous neck supple left otitis media evident lungs clear clear heart regular in rhythm.      Assessment & Plan:  Pression sinusitis with otitis media plan Z-Pak. Symptomatic care discussed. WSL

## 2013-11-12 ENCOUNTER — Ambulatory Visit (INDEPENDENT_AMBULATORY_CARE_PROVIDER_SITE_OTHER): Payer: Managed Care, Other (non HMO)

## 2013-11-12 DIAGNOSIS — Z111 Encounter for screening for respiratory tuberculosis: Secondary | ICD-10-CM

## 2013-11-14 LAB — TB SKIN TEST
Induration: 0 mm
TB Skin Test: NEGATIVE

## 2013-12-09 ENCOUNTER — Other Ambulatory Visit: Payer: Self-pay | Admitting: Family Medicine

## 2013-12-09 NOTE — Telephone Encounter (Signed)
Ok times one 

## 2014-03-13 ENCOUNTER — Telehealth: Payer: Self-pay | Admitting: Family Medicine

## 2014-03-13 NOTE — Telephone Encounter (Signed)
Patient not seen in a year- does she need office visit for form to be completed

## 2014-03-13 NOTE — Telephone Encounter (Signed)
Advised pt she may need an OV, she stated she gets her Phy done at her [facility] office  Told her I would send back the form to see what you thought.

## 2014-03-13 NOTE — Telephone Encounter (Signed)
pt has staff medical report that needs to be filled out for her job.   Last seen 02/26/13

## 2014-03-13 NOTE — Telephone Encounter (Signed)
Form up front for pick up. Patient notified. 

## 2014-03-13 NOTE — Telephone Encounter (Signed)
Ok done

## 2014-04-01 ENCOUNTER — Other Ambulatory Visit: Payer: Self-pay | Admitting: Family Medicine

## 2014-05-02 ENCOUNTER — Other Ambulatory Visit: Payer: Self-pay | Admitting: Family Medicine

## 2014-05-04 NOTE — Telephone Encounter (Signed)
One rx for ha plus one ref  Yrs worth of protonix

## 2014-05-04 NOTE — Telephone Encounter (Signed)
Last seen 07/29/13 for rhinosinusitis

## 2014-05-27 ENCOUNTER — Encounter: Payer: Self-pay | Admitting: Family Medicine

## 2014-05-27 ENCOUNTER — Ambulatory Visit (INDEPENDENT_AMBULATORY_CARE_PROVIDER_SITE_OTHER): Payer: Managed Care, Other (non HMO) | Admitting: Family Medicine

## 2014-05-27 VITALS — BP 138/82

## 2014-05-27 DIAGNOSIS — L259 Unspecified contact dermatitis, unspecified cause: Secondary | ICD-10-CM

## 2014-05-27 MED ORDER — PREDNISONE 20 MG PO TABS: ORAL_TABLET | ORAL | Status: DC

## 2014-05-27 MED ORDER — METHYLPREDNISOLONE ACETATE 40 MG/ML IJ SUSP
40.0000 mg | Freq: Once | INTRAMUSCULAR | Status: AC
  Administered 2014-05-27: 40 mg via INTRAMUSCULAR

## 2014-05-27 NOTE — Progress Notes (Signed)
   Subjective:    Patient ID: Martha Crawford, female    DOB: [birth year], 48 y.o.   MRN: [id number]  Rash This is a new problem. The current episode started in the past 7 days. The affected locations include the face, lips, neck and right hand (nose). The rash is characterized by itchiness. Past treatments include anti-itch cream, antihistamine and moisturizer. The treatment provided no relief.    Denies fever tenderness pain with it  Review of Systems  Skin: Positive for rash.       Objective:   Physical Exam  Extensive contact dermatitis on the face the neck the hand the arm      Assessment & Plan:  Contact dermatitis Depo-Medrol shot as well as prednisone taper preventative measures discuss regarding avoiding this again in the future

## 2014-06-08 ENCOUNTER — Other Ambulatory Visit: Payer: Self-pay | Admitting: Family Medicine

## 2014-06-08 NOTE — Telephone Encounter (Signed)
Ok plus 5 ref 

## 2014-06-08 NOTE — Telephone Encounter (Signed)
Last seen 07/17/13

## 2014-12-14 ENCOUNTER — Other Ambulatory Visit: Payer: Self-pay | Admitting: Family Medicine

## 2014-12-14 NOTE — Telephone Encounter (Signed)
Ok times one 

## 2015-02-11 ENCOUNTER — Other Ambulatory Visit: Payer: Self-pay | Admitting: Family Medicine

## 2015-02-12 NOTE — Telephone Encounter (Signed)
Ok plus two ref 

## 2015-05-11 ENCOUNTER — Other Ambulatory Visit: Payer: Self-pay | Admitting: Family Medicine

## 2015-05-11 NOTE — Telephone Encounter (Signed)
Ok plus three ref 

## 2015-05-18 ENCOUNTER — Encounter: Payer: Self-pay | Admitting: Family Medicine

## 2015-05-18 ENCOUNTER — Ambulatory Visit (INDEPENDENT_AMBULATORY_CARE_PROVIDER_SITE_OTHER): Payer: Managed Care, Other (non HMO) | Admitting: Family Medicine

## 2015-05-18 VITALS — BP 128/76 | Temp 98.7°F | Ht 62.0 in | Wt 166.0 lb

## 2015-05-18 DIAGNOSIS — J329 Chronic sinusitis, unspecified: Secondary | ICD-10-CM | POA: Diagnosis not present

## 2015-05-18 MED ORDER — CEFPROZIL 500 MG PO TABS: 500.0000 mg | ORAL_TABLET | Freq: Two times a day (BID) | ORAL | Status: DC

## 2015-05-18 NOTE — Progress Notes (Signed)
   Subjective:    Patient ID: Martha Crawford, female    DOB: [birth year], 49 y.o.   MRN: [id number]  Cough This is a new problem. Episode onset: 3 days ago. Associated symptoms include ear pain, headaches and nasal congestion. Treatments tried: nyquil, theraflu. The treatment provided no relief.   Voice hoarse    Headache   Mod sore throst worse in the morn   thera flu   nyquil  Bad cong of the nose   Review of Systems  HENT: Positive for ear pain.   Respiratory: Positive for cough.   Neurological: Positive for headaches.       Objective:   Physical Exam  Patient arrives office with headache frontal in nature cough. Sore throat diminished energy achy      Assessment & Plan:  Impression 1 rhinosinusitis plan antibiotics prescribed. Symptom care discussed warning signs discussed WSL

## 2015-05-28 ENCOUNTER — Other Ambulatory Visit: Payer: Self-pay | Admitting: *Deleted

## 2015-05-28 ENCOUNTER — Telehealth: Payer: Self-pay | Admitting: Family Medicine

## 2015-05-28 MED ORDER — FLUCONAZOLE 150 MG PO TABS: ORAL_TABLET | ORAL | Status: DC

## 2015-05-28 NOTE — Telephone Encounter (Signed)
Pt was issued antibiotics an now has a yeast infection Can we call in some cream for her, she does not want the pill   [facility]

## 2015-05-28 NOTE — Telephone Encounter (Signed)
Pt having white vaginal discharge and itching. Was on antibiotic. Pt now states she wants pill instead of otc cream. Diflucan 150mg  #2 one po 3 days apart. Med sent to pharm. Pt verbalized understanding. If  Not better needs to come in office.

## 2015-05-28 NOTE — Telephone Encounter (Signed)
Most of these creams are now available over-the-counter without a prescription. Such as Monistat etc. certainly if patient once prescription medication for yeast infection please call [facility] ask pharmacist which one is prescription she may have it

## 2015-09-14 ENCOUNTER — Other Ambulatory Visit: Payer: Self-pay | Admitting: Family Medicine

## 2015-09-24 ENCOUNTER — Other Ambulatory Visit: Payer: Self-pay | Admitting: Family Medicine

## 2015-09-24 NOTE — Telephone Encounter (Signed)
Ok six mo 

## 2015-10-16 ENCOUNTER — Other Ambulatory Visit: Payer: Self-pay | Admitting: Family Medicine

## 2015-11-18 ENCOUNTER — Other Ambulatory Visit: Payer: Self-pay | Admitting: Family Medicine

## 2015-12-25 ENCOUNTER — Other Ambulatory Visit: Payer: Self-pay | Admitting: Family Medicine

## 2016-01-27 ENCOUNTER — Other Ambulatory Visit: Payer: Self-pay | Admitting: Family Medicine

## 2016-01-27 ENCOUNTER — Other Ambulatory Visit: Payer: Self-pay | Admitting: Nurse Practitioner

## 2016-01-27 ENCOUNTER — Telehealth: Payer: Self-pay | Admitting: Family Medicine

## 2016-01-27 MED ORDER — PANTOPRAZOLE SODIUM 40 MG PO TBEC: DELAYED_RELEASE_TABLET | ORAL | 1 refills | Status: DC

## 2016-01-27 NOTE — Telephone Encounter (Signed)
Done

## 2016-01-27 NOTE — Telephone Encounter (Signed)
pantoprazole (PROTONIX) 40 MG tablet    Pt is needing a refill on this, due to her work hours she can't make it in here until 9/11   [facility]

## 2016-03-06 ENCOUNTER — Encounter: Payer: Self-pay | Admitting: Nurse Practitioner

## 2016-03-06 ENCOUNTER — Ambulatory Visit (INDEPENDENT_AMBULATORY_CARE_PROVIDER_SITE_OTHER): Payer: 59 | Admitting: Nurse Practitioner

## 2016-03-06 VITALS — BP 132/84 | Ht 62.0 in | Wt 166.8 lb

## 2016-03-06 DIAGNOSIS — F411 Generalized anxiety disorder: Secondary | ICD-10-CM | POA: Diagnosis not present

## 2016-03-06 DIAGNOSIS — K219 Gastro-esophageal reflux disease without esophagitis: Secondary | ICD-10-CM | POA: Diagnosis not present

## 2016-03-06 MED ORDER — CLONAZEPAM 0.5 MG PO TABS: ORAL_TABLET | ORAL | 5 refills | Status: DC

## 2016-03-06 MED ORDER — PANTOPRAZOLE SODIUM 40 MG PO TBEC: DELAYED_RELEASE_TABLET | ORAL | 5 refills | Status: DC

## 2016-03-06 MED ORDER — SERTRALINE HCL 50 MG PO TABS: ORAL_TABLET | ORAL | 2 refills | Status: DC

## 2016-03-06 NOTE — Progress Notes (Signed)
Subjective:  Presents for recheck. Has experienced increased anxiety lately due to increased personal stress. Takes Xanax for panic attacks during the day; has to be careful due to drowsiness. Having emotional lability. Denies suicidal or homicidal thoughts or ideation. Reflux stable on protonix. Gets regular wellness exams with GYN.   Objective:   BP 132/84   Ht 5\' 2"  (1.575 m)   Wt 166 lb 12.8 oz (75.7 kg)   BMI 30.51 kg/m  NAD. Alert, oriented. Lungs clear. Heart RRR. Abdomen soft, non tender.   Assessment:  Problem List Items Addressed This Visit      Digestive   Esophageal reflux   Relevant Medications   pantoprazole (PROTONIX) 40 MG tablet     Other   Generalized anxiety disorder - Primary    Other Visit Diagnoses   None.    Plan:  Meds ordered this encounter  Medications  . pantoprazole (PROTONIX) 40 MG tablet    Sig: TAKE (1) TABLET BY MOUTH ONCE DAILY.    Dispense:  30 tablet    Refill:  5    Order Specific Question:   Supervising Provider    Answer:   Merlyn AlbertLUKING, WILLIAM S [[id number]]  . clonazePAM (KLONOPIN) 0.5 MG tablet    Sig: 1/2-1 tab po BID prn anxiety    Dispense:  30 tablet    Refill:  5    Order Specific Question:   Supervising Provider    Answer:   Merlyn AlbertLUKING, WILLIAM S [[id number]]  . sertraline (ZOLOFT) 50 MG tablet    Sig: 1/2 tab po qd x 6 d then one po qd    Dispense:  30 tablet    Refill:  2    Order Specific Question:   Supervising Provider    Answer:   Merlyn AlbertLUKING, WILLIAM S [[id number]]   Stop Xanax. Switch to Klonopin. Start Zoloft as directed. DC med and call if any problems.  Return in about 1 month (around 04/05/2016) for recheck in 4-6 weeks.

## 2016-03-26 ENCOUNTER — Other Ambulatory Visit: Payer: Self-pay | Admitting: Family Medicine

## 2016-03-27 NOTE — Telephone Encounter (Signed)
Ok times one 

## 2016-04-10 ENCOUNTER — Ambulatory Visit (INDEPENDENT_AMBULATORY_CARE_PROVIDER_SITE_OTHER): Payer: 59 | Admitting: Nurse Practitioner

## 2016-04-10 ENCOUNTER — Encounter: Payer: Self-pay | Admitting: Nurse Practitioner

## 2016-04-10 VITALS — BP 122/76 | Ht 62.0 in | Wt 164.8 lb

## 2016-04-10 DIAGNOSIS — F411 Generalized anxiety disorder: Secondary | ICD-10-CM | POA: Diagnosis not present

## 2016-04-11 ENCOUNTER — Encounter: Payer: Self-pay | Admitting: Nurse Practitioner

## 2016-04-11 NOTE — Progress Notes (Signed)
Subjective:  Presents for recheck of her anxiety. Currently on Zoloft 50 mg daily. No further panic attacks. Has had not had to take her Klonopin. Still experiencing some emotional lability much improved. Still having some difficulty sleeping. Overall has seen improvement from previous visit. Denies any adverse effects from Zoloft.  Objective:   BP 122/76   Ht 5\' 2"  (1.575 m)   Wt 164 lb 12.8 oz (74.8 kg)   BMI 30.14 kg/m  NAD. Alert, oriented. Cheerful affect. Thoughts logical coherent and relevant. Dressed appropriately. Good eye contact. Lungs clear. Heart regular rate rhythm.  Assessment:  Problem List Items Addressed This Visit      Other   Generalized anxiety disorder - Primary    Other Visit Diagnoses   None.    Plan: Increase Zoloft to 1 and a half tabs (75 mg). Let us know how this dose is working. May need to increase to 100 mg. If any problems, go back to previous dose.  Return in about 3 months (around 07/11/2016) for recheck.

## 2016-04-30 ENCOUNTER — Encounter: Payer: Self-pay | Admitting: Nurse Practitioner

## 2016-05-01 ENCOUNTER — Encounter: Payer: Self-pay | Admitting: Nurse Practitioner

## 2016-05-02 ENCOUNTER — Telehealth: Payer: Self-pay | Admitting: Family Medicine

## 2016-05-02 MED ORDER — SERTRALINE HCL 50 MG PO TABS: ORAL_TABLET | ORAL | 2 refills | Status: DC

## 2016-05-02 NOTE — Telephone Encounter (Signed)
Patient needs Rx for sertraline (ZOLOFT) 50 MG tablet.  The dosage was increased to 1 1/2 per day and this needs to be updated with the pharmacy because she is completely out.    [facility]

## 2016-05-02 NOTE — Telephone Encounter (Signed)
Adjust and ref six mo worth

## 2016-05-02 NOTE — Telephone Encounter (Signed)
Prescription sent electronically to pharmacy. Patient notified. 

## 2016-06-27 ENCOUNTER — Other Ambulatory Visit: Payer: Self-pay | Admitting: Family Medicine

## 2016-06-27 NOTE — Telephone Encounter (Signed)
Ok times one 

## 2016-07-11 ENCOUNTER — Ambulatory Visit: Payer: 59 | Admitting: Nurse Practitioner

## 2016-08-07 ENCOUNTER — Other Ambulatory Visit: Payer: Self-pay | Admitting: Family Medicine

## 2016-08-08 NOTE — Telephone Encounter (Signed)
okk plus 2 ref

## 2016-08-15 ENCOUNTER — Ambulatory Visit (INDEPENDENT_AMBULATORY_CARE_PROVIDER_SITE_OTHER): Payer: 59 | Admitting: Family Medicine

## 2016-08-15 ENCOUNTER — Encounter: Payer: Self-pay | Admitting: Family Medicine

## 2016-08-15 VITALS — BP 130/90 | Ht 62.0 in | Wt 167.0 lb

## 2016-08-15 DIAGNOSIS — G44209 Tension-type headache, unspecified, not intractable: Secondary | ICD-10-CM | POA: Diagnosis not present

## 2016-08-15 DIAGNOSIS — I10 Essential (primary) hypertension: Secondary | ICD-10-CM

## 2016-08-15 DIAGNOSIS — F411 Generalized anxiety disorder: Secondary | ICD-10-CM | POA: Diagnosis not present

## 2016-08-15 DIAGNOSIS — R5383 Other fatigue: Secondary | ICD-10-CM | POA: Diagnosis not present

## 2016-08-15 MED ORDER — ENALAPRIL MALEATE 5 MG PO TABS: 5.0000 mg | ORAL_TABLET | Freq: Every day | ORAL | 5 refills | Status: DC

## 2016-08-15 NOTE — Progress Notes (Signed)
   Subjective:    Patient ID: Martha Crawford, female    DOB: 11/06/1965, 51 y.o.   MRN: [id number] Patient presents with numerous concerns. Hypertension  This is a new problem. The current episode started 1 to 4 weeks ago. (Headaches, dizziness) There are no associated agents to hypertension. There are no known risk factors for coronary artery disease. Past treatments include nothing. There are no compliance problems.    Patient has no other concerns at this time.   BP has been elevated at times  Pt notes frquent headaces, uses as need ed for chronic headaches  Has not felt the best  Pt states diastolic numbers has ran mostly in the nineties  Both parents on high bp medicine  Pt gets on t mill but not every day  Pt just quit her job because of stess, and felt panic attacks,  symtoms improved some  Dull heaeadache most ays frontal rad to back, along with dull ache  Not feeling the best  Now out of job since mid December  Pt takes zoloft just one tab pe4 day., takes klonopin ust a half as needed for sig anxiety, generally at night  Walks three times per wk for maybe a mile  Uses butalbital acet caff generally most every day, took frequently when working    Patient notes ongoing compliance with antidepressant medication. No obvious side effects. Reports does not miss a dose. Overall continues to help depression substantially. No thoughts of homicide or suicide. Would like to maintain medication.   Review of Systems No headache, no major weight loss or weight gain, no chest pain no back pain abdominal pain no change in bowel habits complete ROS otherwise negative     Objective:   Physical Exam Vital stable blood pressure 138/90 4 repeat HEENT otherwise normal neck supple lungs clear. Heart regular in rhythm. Neuro intact. Affect appropriate slightly anxious       Assessment & Plan:  Impression 1 depression anxiety with recent flare due to job circumstances. No longer  working there. Still experiencing periods of anxiety and panic and depression. No suicidal thoughts #2 tension headaches nearly daily the butalbital with caffeine helps some #3 hypertension elevated blood pressure in recent weeks. Strong family history. Patient worried about it. Plan long discussion held will initiate blood pressure therapy. Often with weight but with patient quite concerned and other factors we'll press on and cover. Diet discussed exercise discussed easily 25 minutes spent most in discussion also blood work to assess for ferrous causes of fatigue

## 2016-08-15 NOTE — Patient Instructions (Signed)
Hypertension Hypertension, commonly called high blood pressure, is when the force of blood pumping through your arteries is too strong. Your arteries are the blood vessels that carry blood from your heart throughout your body. A blood pressure reading consists of a higher number over a lower number, such as 110/72. The higher number (systolic) is the pressure inside your arteries when your heart pumps. The lower number (diastolic) is the pressure inside your arteries when your heart relaxes. Ideally you want your blood pressure below 120/80. Hypertension forces your heart to work harder to pump blood. Your arteries may become narrow or stiff. Having untreated or uncontrolled hypertension can cause heart attack, stroke, kidney disease, and other problems. What increases the risk? Some risk factors for high blood pressure are controllable. Others are not. Risk factors you cannot control include:  Race. You may be at higher risk if you are African American.  Age. Risk increases with age.  Gender. Men are at higher risk than women before age 45 years. After age 65, women are at higher risk than men. Risk factors you can control include:  Not getting enough exercise or physical activity.  Being overweight.  Getting too much fat, sugar, calories, or salt in your diet.  Drinking too much alcohol. What are the signs or symptoms? Hypertension does not usually cause signs or symptoms. Extremely high blood pressure (hypertensive crisis) may cause headache, anxiety, shortness of breath, and nosebleed. How is this diagnosed? To check if you have hypertension, your health care provider will measure your blood pressure while you are seated, with your arm held at the level of your heart. It should be measured at least twice using the same arm. Certain conditions can cause a difference in blood pressure between your right and left arms. A blood pressure reading that is higher than normal on one occasion does  not mean that you need treatment. If it is not clear whether you have high blood pressure, you may be asked to return on a different day to have your blood pressure checked again. Or, you may be asked to monitor your blood pressure at home for 1 or more weeks. How is this treated? Treating high blood pressure includes making lifestyle changes and possibly taking medicine. Living a healthy lifestyle can help lower high blood pressure. You may need to change some of your habits. Lifestyle changes may include:  Following the DASH diet. This diet is high in fruits, vegetables, and whole grains. It is low in salt, red meat, and added sugars.  Keep your sodium intake below 2,300 mg per day.  Getting at least 30-45 minutes of aerobic exercise at least 4 times per week.  Losing weight if necessary.  Not smoking.  Limiting alcoholic beverages.  Learning ways to reduce stress. Your health care provider may prescribe medicine if lifestyle changes are not enough to get your blood pressure under control, and if one of the following is true:  You are 18-59 years of age and your systolic blood pressure is above 140.  You are 60 years of age or older, and your systolic blood pressure is above 150.  Your diastolic blood pressure is above 90.  You have diabetes, and your systolic blood pressure is over 140 or your diastolic blood pressure is over 90.  You have kidney disease and your blood pressure is above 140/90.  You have heart disease and your blood pressure is above 140/90. Your personal target blood pressure may vary depending on your medical   conditions, your age, and other factors. Follow these instructions at home:  Have your blood pressure rechecked as directed by your health care provider.  Take medicines only as directed by your health care provider. Follow the directions carefully. Blood pressure medicines must be taken as prescribed. The medicine does not work as well when you skip  doses. Skipping doses also puts you at risk for problems.  Do not smoke.  Monitor your blood pressure at home as directed by your health care provider. Contact a health care provider if:  You think you are having a reaction to medicines taken.  You have recurrent headaches or feel dizzy.  You have swelling in your ankles.  You have trouble with your vision. Get help right away if:  You develop a severe headache or confusion.  You have unusual weakness, numbness, or feel faint.  You have severe chest or abdominal pain.  You vomit repeatedly.  You have trouble breathing. This information is not intended to replace advice given to you by your health care provider. Make sure you discuss any questions you have with your health care provider. Document Released: 06/12/2005 Document Revised: 11/18/2015 Document Reviewed: 04/04/2013 Elsevier Interactive Patient Education  2017 [employer]vier Inc.  

## 2016-08-16 DIAGNOSIS — I10 Essential (primary) hypertension: Secondary | ICD-10-CM | POA: Insufficient documentation

## 2016-08-17 ENCOUNTER — Other Ambulatory Visit: Payer: Self-pay | Admitting: Family Medicine

## 2016-08-17 DIAGNOSIS — R5383 Other fatigue: Secondary | ICD-10-CM | POA: Diagnosis not present

## 2016-08-18 LAB — BASIC METABOLIC PANEL
BUN/Creatinine Ratio: 20 (ref 9–23)
BUN: 10 mg/dL (ref 6–24)
CALCIUM: 8.8 mg/dL (ref 8.7–10.2)
CO2: 23 mmol/L (ref 18–29)
Chloride: 101 mmol/L (ref 96–106)
Creatinine, Ser: 0.5 mg/dL — ABNORMAL LOW (ref 0.57–1.00)
GFR calc Af Amer: 132 (ref >59)
GFR calc non Af Amer: 115 (ref >59)
GLUCOSE: 86 mg/dL (ref 65–99)
Potassium: 4.2 mmol/L (ref 3.5–5.2)
Sodium: 141 mmol/L (ref 134–144)

## 2016-08-18 LAB — CBC WITH DIFFERENTIAL/PLATELET
BASOS ABS: 0 10*3/uL (ref 0.0–0.2)
Basos: 0 %
EOS (ABSOLUTE): 0.3 10*3/uL (ref 0.0–0.4)
Eos: 5 %
HEMOGLOBIN: 11.6 g/dL (ref 11.1–15.9)
Hematocrit: 35.4 % (ref 34.0–46.6)
Immature Grans (Abs): 0 10*3/uL (ref 0.0–0.1)
Immature Granulocytes: 0 %
LYMPHS ABS: 2.1 10*3/uL (ref 0.7–3.1)
Lymphs: 28 %
MCH: 28.8 pg (ref 26.6–33.0)
MCHC: 32.8 g/dL (ref 31.5–35.7)
MCV: 88 fL (ref 79–97)
MONOCYTES: 11 %
Monocytes Absolute: 0.8 10*3/uL (ref 0.1–0.9)
NEUTROS PCT: 56 %
Neutrophils Absolute: 4.1 10*3/uL (ref 1.4–7.0)
Platelets: 334 10*3/uL (ref 150–379)
RBC: 4.03 x10E6/uL (ref 3.77–5.28)
RDW: 14.3 % (ref 12.3–15.4)
WBC: 7.4 10*3/uL (ref 3.4–10.8)

## 2016-08-18 LAB — TSH: TSH: 3.68 u[IU]/mL (ref 0.450–4.500)

## 2016-08-18 LAB — HEPATIC FUNCTION PANEL
ALK PHOS: 80 IU/L (ref 39–117)
ALT: 15 IU/L (ref 0–32)
AST: 14 IU/L (ref 0–40)
Albumin: 4 g/dL (ref 3.5–5.5)
BILIRUBIN, DIRECT: 0.1 mg/dL (ref 0.00–0.40)
Bilirubin Total: 0.4 mg/dL (ref 0.0–1.2)
Total Protein: 6.6 g/dL (ref 6.0–8.5)

## 2016-08-18 LAB — LIPID PANEL
Chol/HDL Ratio: 3.7 (ref 0.0–4.4)
Cholesterol, Total: 177 mg/dL (ref 100–199)
HDL: 48 mg/dL (ref >39)
LDL Calculated: 115 — ABNORMAL HIGH (ref 0–99)
Triglycerides: 69 mg/dL (ref 0–149)
VLDL CHOLESTEROL CAL: 14 (ref 5–40)

## 2016-08-20 ENCOUNTER — Encounter: Payer: Self-pay | Admitting: Family Medicine

## 2016-09-16 ENCOUNTER — Other Ambulatory Visit: Payer: Self-pay | Admitting: Nurse Practitioner

## 2016-10-04 ENCOUNTER — Other Ambulatory Visit: Payer: Self-pay | Admitting: Family Medicine

## 2016-11-13 ENCOUNTER — Ambulatory Visit: Payer: 59 | Admitting: Family Medicine

## 2016-11-20 ENCOUNTER — Other Ambulatory Visit: Payer: Self-pay | Admitting: Family Medicine

## 2016-11-21 NOTE — Telephone Encounter (Signed)
Last seen 08/15/2016 

## 2016-12-20 ENCOUNTER — Other Ambulatory Visit: Payer: Self-pay | Admitting: Family Medicine

## 2016-12-21 NOTE — Telephone Encounter (Signed)
Ok plus one ref 

## 2016-12-21 NOTE — Telephone Encounter (Signed)
Last seen 08/15/2016 

## 2017-01-06 ENCOUNTER — Other Ambulatory Visit: Payer: Self-pay | Admitting: Family Medicine

## 2017-02-10 ENCOUNTER — Other Ambulatory Visit: Payer: Self-pay | Admitting: Family Medicine

## 2017-02-12 NOTE — Telephone Encounter (Signed)
Ok times one 

## 2017-04-04 ENCOUNTER — Other Ambulatory Visit: Payer: Self-pay | Admitting: Nurse Practitioner

## 2017-04-04 ENCOUNTER — Other Ambulatory Visit: Payer: Self-pay | Admitting: Family Medicine

## 2017-04-04 NOTE — Telephone Encounter (Signed)
30 d ok , time for f u visit initiated bp meds a half yr ago

## 2017-04-04 NOTE — Telephone Encounter (Signed)
Last seen for anxiety 08/02/16

## 2017-04-06 ENCOUNTER — Other Ambulatory Visit: Payer: Self-pay | Admitting: Family Medicine

## 2017-04-06 NOTE — Telephone Encounter (Signed)
Ok times one 

## 2017-04-08 ENCOUNTER — Other Ambulatory Visit: Payer: Self-pay | Admitting: Family Medicine

## 2017-04-09 NOTE — Telephone Encounter (Signed)
Ok plus one ref 

## 2017-04-09 NOTE — Telephone Encounter (Signed)
Last seen 08/15/2016 

## 2017-04-10 ENCOUNTER — Other Ambulatory Visit: Payer: Self-pay | Admitting: Family Medicine

## 2017-05-04 ENCOUNTER — Other Ambulatory Visit: Payer: Self-pay | Admitting: Family Medicine

## 2017-05-04 ENCOUNTER — Other Ambulatory Visit: Payer: Self-pay | Admitting: Nurse Practitioner

## 2017-05-29 ENCOUNTER — Other Ambulatory Visit: Payer: Self-pay | Admitting: Family Medicine

## 2017-05-29 NOTE — Telephone Encounter (Signed)
May have this +2 additional refill needs follow-up office visit with Dr. Brett CanalesSteve or Eber Jonesarolyn

## 2017-05-29 NOTE — Telephone Encounter (Signed)
Last seen Feb 2018 for Fatigue

## 2017-06-01 ENCOUNTER — Other Ambulatory Visit: Payer: Self-pay | Admitting: Family Medicine

## 2017-10-05 ENCOUNTER — Other Ambulatory Visit: Payer: Self-pay | Admitting: Family Medicine

## 2017-10-05 NOTE — Telephone Encounter (Signed)
This is Dr. Steve's patient thank you 

## 2017-10-05 NOTE — Telephone Encounter (Signed)
One mo only needs ov 

## 2017-10-22 ENCOUNTER — Other Ambulatory Visit: Payer: Self-pay | Admitting: Family Medicine

## 2017-10-29 ENCOUNTER — Telehealth: Payer: Self-pay

## 2017-10-29 NOTE — Telephone Encounter (Signed)
Dr Steves 

## 2017-10-29 NOTE — Telephone Encounter (Signed)
[facility] requesting Fioricet 50-325-40 mg take one to two tablets every four to six hours prn pain.#50

## 2017-10-30 MED ORDER — BUTALBITAL-APAP-CAFFEINE 50-325-40 MG PO TABS: ORAL_TABLET | ORAL | 0 refills | Status: DC

## 2017-10-30 NOTE — Telephone Encounter (Signed)
Patient notified that prescription will be sent in by end of day(awaiting md sign)and patient scheduled follow up office visit

## 2017-10-30 NOTE — Telephone Encounter (Signed)
Ok times one 

## 2017-10-30 NOTE — Telephone Encounter (Signed)
Script printed, awaiting signature. 

## 2017-10-30 NOTE — Telephone Encounter (Signed)
Faxed to [facility] 

## 2017-11-09 ENCOUNTER — Encounter: Payer: Self-pay | Admitting: Family Medicine

## 2017-11-09 ENCOUNTER — Ambulatory Visit: Payer: 59 | Admitting: Family Medicine

## 2017-11-09 ENCOUNTER — Other Ambulatory Visit: Payer: Self-pay | Admitting: Nurse Practitioner

## 2017-11-09 VITALS — BP 128/88 | Ht 62.0 in | Wt 178.0 lb

## 2017-11-09 DIAGNOSIS — I10 Essential (primary) hypertension: Secondary | ICD-10-CM | POA: Diagnosis not present

## 2017-11-09 DIAGNOSIS — G44209 Tension-type headache, unspecified, not intractable: Secondary | ICD-10-CM

## 2017-11-09 DIAGNOSIS — F411 Generalized anxiety disorder: Secondary | ICD-10-CM | POA: Diagnosis not present

## 2017-11-09 MED ORDER — SERTRALINE HCL 50 MG PO TABS: ORAL_TABLET | ORAL | 1 refills | Status: DC

## 2017-11-09 MED ORDER — BUTALBITAL-APAP-CAFFEINE 50-325-40 MG PO TABS: ORAL_TABLET | ORAL | 5 refills | Status: DC

## 2017-11-09 MED ORDER — ENALAPRIL MALEATE 5 MG PO TABS: ORAL_TABLET | ORAL | 5 refills | Status: DC

## 2017-11-09 NOTE — Progress Notes (Signed)
   Subjective:    Patient ID: Martha Crawford, female    DOB: 08-02-1965, 52 y.o.   MRN: [id number]  HPI  Patient is here today to follow up on her migraine headaches.   Blood pressure medicine and blood pressure levels reviewed today with patient. Compliant with blood pressure medicine. States does not miss a dose. No obvious side effects. Blood pressure generally good when checked elsewhere. Watching salt intake.  Not exercising due to  Injury of ankle sprain,sprained by [place] at  [place] with sec injury   Patient notes ongoing compliance with antidepressant medication. No obvious side effects. Reports does not miss a dose. Overall continues to help depression substantially. No thoughts of homicide or suicide. Would like to maintain medication.   Rare use on the clonaze[pan    headaches overall improved, has switched jobs, less stress, now doing etter, takes about one or so per day, usually worse with weathdr change or having emotional problmes  Post to ant distibution   bp not cking   They have been better per the pt. She states she takes Fioricet one prn.  Review of Systems No headache, no major weight loss or weight gain, no chest pain no back pain abdominal pain no change in bowel habits complete ROS otherwise negative     Objective:   Physical Exam Alert and oriented, vitals reviewed and stable, NAD ENT-TM's and ext canals WNL bilat via otoscopic exam Soft palate, tonsils and post pharynx WNL via oropharyngeal exam Neck-symmetric, no masses; thyroid nonpalpable and nontender Pulmonary-no tachypnea or accessory muscle use; Clear without wheezes via auscultation Card--no abnrml murmurs, rhythm reg and rate WNL Carotid pulses symmetric, without bruits        Assessment & Plan:  1i impression 1.  Tension headaches.  Substantial benefit from current medicine.  Patient likely  2.  Generalized anxiety disorder with element of depression patient reports daily soft  definitely helping would like to maintain  3.  Hypertension.  Good control discussed compliance discussed salt intake discussed medications refilled diet exercise discussed follow-up in 6 months

## 2017-11-09 NOTE — Telephone Encounter (Signed)
Brett Canales, I noted you saw patient today. Was not sure if you wanted to continue this medication.

## 2017-12-18 ENCOUNTER — Other Ambulatory Visit: Payer: Self-pay | Admitting: Nurse Practitioner

## 2018-02-28 ENCOUNTER — Other Ambulatory Visit: Payer: Self-pay | Admitting: Family Medicine

## 2018-02-28 NOTE — Telephone Encounter (Signed)
Will have this  Sam 3 refills

## 2018-05-13 ENCOUNTER — Ambulatory Visit: Payer: 59 | Admitting: Family Medicine

## 2018-05-14 ENCOUNTER — Other Ambulatory Visit: Payer: Self-pay

## 2018-05-14 ENCOUNTER — Telehealth: Payer: Self-pay | Admitting: Family Medicine

## 2018-05-14 MED ORDER — PANTOPRAZOLE SODIUM 40 MG PO TBEC: DELAYED_RELEASE_TABLET | ORAL | 2 refills | Status: DC

## 2018-05-14 NOTE — Telephone Encounter (Signed)
Medication sent to pharmacy and pt is aware 

## 2018-05-14 NOTE — Telephone Encounter (Signed)
Pt states pharmacy sent script for pantoprazole (PROTONIX) 40 MG tablet over a week ago, pt checking status on refill, advise.    [facility] - [place], [place] - [address]

## 2018-06-12 ENCOUNTER — Other Ambulatory Visit: Payer: Self-pay | Admitting: Family Medicine

## 2018-06-13 ENCOUNTER — Encounter: Payer: Self-pay | Admitting: Family Medicine

## 2018-06-13 ENCOUNTER — Ambulatory Visit: Payer: 59 | Admitting: Family Medicine

## 2018-06-13 VITALS — BP 128/80 | Temp 98.4°F | Ht 62.0 in | Wt 183.6 lb

## 2018-06-13 DIAGNOSIS — I10 Essential (primary) hypertension: Secondary | ICD-10-CM

## 2018-06-13 DIAGNOSIS — R5383 Other fatigue: Secondary | ICD-10-CM

## 2018-06-13 DIAGNOSIS — J329 Chronic sinusitis, unspecified: Secondary | ICD-10-CM

## 2018-06-13 DIAGNOSIS — F411 Generalized anxiety disorder: Secondary | ICD-10-CM

## 2018-06-13 DIAGNOSIS — Z13220 Encounter for screening for lipoid disorders: Secondary | ICD-10-CM

## 2018-06-13 DIAGNOSIS — Z79899 Other long term (current) drug therapy: Secondary | ICD-10-CM | POA: Diagnosis not present

## 2018-06-13 MED ORDER — PANTOPRAZOLE SODIUM 40 MG PO TBEC: DELAYED_RELEASE_TABLET | ORAL | 5 refills | Status: DC

## 2018-06-13 MED ORDER — ENALAPRIL MALEATE 5 MG PO TABS: ORAL_TABLET | ORAL | 5 refills | Status: DC

## 2018-06-13 MED ORDER — CLONAZEPAM 0.5 MG PO TABS: ORAL_TABLET | ORAL | 5 refills | Status: DC

## 2018-06-13 MED ORDER — AMOXICILLIN 500 MG PO CAPS: 500.0000 mg | ORAL_CAPSULE | Freq: Three times a day (TID) | ORAL | 0 refills | Status: DC

## 2018-06-13 MED ORDER — SERTRALINE HCL 50 MG PO TABS: ORAL_TABLET | ORAL | 5 refills | Status: DC

## 2018-06-13 NOTE — Progress Notes (Signed)
   Subjective:    Patient ID: Martha Crawford, female    DOB: [birth year], 52 y.o.   MRN: [id number]  Hypertension  This is a chronic problem. Treatments tried: enalapril  Compliance problems: has been cutting her dose of enalapril in half because she thought it was causing headaches.     Sinus drainage, sore throat, watery eyes, cough for one week.   Patient notes ongoing compliance with antidepressant medication. No obvious side effects. Reports does not miss a dose. Overall continues to help depression substantially. No thoughts of homicide or suicide. Would like to maintain medication.   anixty  ethroat scratchy and drange and congestion and sneezing watery eyes felt bad  Am  Ox  Today needs to knock it out     Half  Tab pt notes b p still god n h a has improved    Exercise not doing  Blood pressure medicine and blood pressure levels reviewed today with patient. Compliant with blood pressure medicine. States does not miss a dose. No obvious side effects. Blood pressure generally good when checked elsewhere. Watching salt intake.    .ht Review of Systems No headache, no major weight loss or weight gain, no chest pain no back pain abdominal pain no change in bowel habits complete ROS otherwise negative     Objective:   Physical Exam Alert and oriented, vitals reviewed and stable, NAD ENT-TM's and ext canals WNL positive substantial nasal congestion stuffiness frontal tenderness.  Bilat via otoscopic exam Soft palate, tonsils and post pharynx WNL via oropharyngeal exam Neck-symmetric, no masses; thyroid nonpalpable and nontender Pulmonary-no tachypnea or accessory muscle use; Clear without wheezes via auscultation Card--no abnrml murmurs, rhythm reg and rate WNL Carotid pulses symmetric, without bruits        Assessment & Plan:  Impression acute rhinosinusitis discussed antibiotics prescribed symptom care discussed  2.  Hypertension good control discussed maintain same  meds  3.  Generalized anxiety disorder with small amount of depression.  Clinically very stable patient wishes to stay on medication meds refilled  Follow-up in 6 months/appropriate blood noted

## 2018-07-16 DIAGNOSIS — R5383 Other fatigue: Secondary | ICD-10-CM | POA: Diagnosis not present

## 2018-07-16 DIAGNOSIS — Z79899 Other long term (current) drug therapy: Secondary | ICD-10-CM | POA: Diagnosis not present

## 2018-07-16 DIAGNOSIS — Z13220 Encounter for screening for lipoid disorders: Secondary | ICD-10-CM | POA: Diagnosis not present

## 2018-07-17 LAB — CBC WITH DIFFERENTIAL/PLATELET
Basophils Absolute: 0 10*3/uL (ref 0.0–0.2)
Basos: 0 %
EOS (ABSOLUTE): 0.3 10*3/uL (ref 0.0–0.4)
Eos: 5 %
Hematocrit: 35.1 % (ref 34.0–46.6)
Hemoglobin: 11.2 g/dL (ref 11.1–15.9)
Immature Grans (Abs): 0 10*3/uL (ref 0.0–0.1)
Immature Granulocytes: 0 %
Lymphocytes Absolute: 1.8 10*3/uL (ref 0.7–3.1)
Lymphs: 26 %
MCH: 27.5 pg (ref 26.6–33.0)
MCHC: 31.9 g/dL (ref 31.5–35.7)
MCV: 86 fL (ref 79–97)
MONOCYTES: 11 %
Monocytes Absolute: 0.7 10*3/uL (ref 0.1–0.9)
NEUTROS ABS: 3.9 10*3/uL (ref 1.4–7.0)
Neutrophils: 58 %
Platelets: 418 10*3/uL (ref 150–450)
RBC: 4.08 x10E6/uL (ref 3.77–5.28)
RDW: 13.6 % (ref 11.7–15.4)
WBC: 6.8 10*3/uL (ref 3.4–10.8)

## 2018-07-17 LAB — LIPID PANEL
CHOLESTEROL TOTAL: 165 mg/dL (ref 100–199)
Chol/HDL Ratio: 3.6 ratio (ref 0.0–4.4)
HDL: 46 mg/dL (ref >39)
LDL Calculated: 109 mg/dL — ABNORMAL HIGH (ref 0–99)
Triglycerides: 49 mg/dL (ref 0–149)
VLDL CHOLESTEROL CAL: 10 mg/dL (ref 5–40)

## 2018-07-17 LAB — BASIC METABOLIC PANEL
BUN/Creatinine Ratio: 12 (ref 9–23)
BUN: 6 mg/dL (ref 6–24)
CO2: 23 mmol/L (ref 20–29)
CREATININE: 0.5 mg/dL — AB (ref 0.57–1.00)
Calcium: 9 mg/dL (ref 8.7–10.2)
Chloride: 102 mmol/L (ref 96–106)
GFR calc Af Amer: 131 mL/min/{1.73_m2} (ref >59)
GFR, EST NON AFRICAN AMERICAN: 113 mL/min/{1.73_m2} (ref >59)
Glucose: 90 mg/dL (ref 65–99)
POTASSIUM: 4.3 mmol/L (ref 3.5–5.2)
SODIUM: 138 mmol/L (ref 134–144)

## 2018-07-17 LAB — HEPATIC FUNCTION PANEL
ALBUMIN: 3.8 g/dL (ref 3.8–4.8)
ALK PHOS: 99 IU/L (ref 39–117)
ALT: 13 IU/L (ref 0–32)
AST: 15 IU/L (ref 0–40)
BILIRUBIN TOTAL: 0.3 mg/dL (ref 0.0–1.2)
BILIRUBIN, DIRECT: 0.09 mg/dL (ref 0.00–0.40)
Total Protein: 6.6 g/dL (ref 6.0–8.5)

## 2018-07-17 LAB — TSH: TSH: 2.86 u[IU]/mL (ref 0.450–4.500)

## 2018-07-22 ENCOUNTER — Encounter: Payer: Self-pay | Admitting: Family Medicine

## 2018-10-02 ENCOUNTER — Other Ambulatory Visit: Payer: Self-pay | Admitting: *Deleted

## 2018-10-02 ENCOUNTER — Telehealth: Payer: Self-pay | Admitting: Family Medicine

## 2018-10-02 MED ORDER — SERTRALINE HCL 50 MG PO TABS: ORAL_TABLET | ORAL | 5 refills | Status: DC

## 2018-10-02 NOTE — Telephone Encounter (Signed)
Pt has been taking sertraline (ZOLOFT) 50 MG tablet originally she was taking one and a half tablets. She has cut back to just taking one tablet a day but with everything going on she has bumped back up to the tablet and a half and is needing a new script called in to    [facility] [place], [place] - [address]

## 2018-10-02 NOTE — Telephone Encounter (Signed)
Last seen 06/13/18. Please advise. Thank you

## 2018-10-02 NOTE — Telephone Encounter (Signed)
Refills sent to pharm. Pt notified.  

## 2018-10-02 NOTE — Telephone Encounter (Signed)
Ok six mo worth at that dose

## 2018-11-19 ENCOUNTER — Other Ambulatory Visit: Payer: Self-pay | Admitting: Family Medicine

## 2018-11-20 NOTE — Telephone Encounter (Signed)
Ok times one 

## 2018-12-13 ENCOUNTER — Other Ambulatory Visit: Payer: Self-pay

## 2018-12-13 ENCOUNTER — Ambulatory Visit (INDEPENDENT_AMBULATORY_CARE_PROVIDER_SITE_OTHER): Payer: 59 | Admitting: Family Medicine

## 2018-12-13 DIAGNOSIS — F411 Generalized anxiety disorder: Secondary | ICD-10-CM | POA: Diagnosis not present

## 2018-12-13 DIAGNOSIS — I10 Essential (primary) hypertension: Secondary | ICD-10-CM | POA: Diagnosis not present

## 2018-12-13 MED ORDER — CLONAZEPAM 0.5 MG PO TABS: ORAL_TABLET | ORAL | 5 refills | Status: DC

## 2018-12-13 MED ORDER — SERTRALINE HCL 50 MG PO TABS: ORAL_TABLET | ORAL | 5 refills | Status: DC

## 2018-12-13 MED ORDER — PANTOPRAZOLE SODIUM 40 MG PO TBEC: DELAYED_RELEASE_TABLET | ORAL | 5 refills | Status: DC

## 2018-12-13 MED ORDER — ENALAPRIL MALEATE 5 MG PO TABS: ORAL_TABLET | ORAL | 5 refills | Status: DC

## 2018-12-13 NOTE — Progress Notes (Signed)
   Subjective:    Patient ID: Martha Crawford, female    DOB: [birth year], 53 y.o.   MRN: [id number] Audio only Hypertension This is a chronic problem. The current episode started more than 1 year ago. Risk factors for coronary artery disease include post-menopausal state. Treatments tried: vasotec. There are no compliance problems.    Patient states she is not having any problems or concerns.  Virtual Visit via Video Note  I connected with Martha Crawford on 12/13/18 at 10:30 AM EDT by a video enabled telemedicine application and verified that I am speaking with the correct person using two identifiers.  Location: Patient: home Provider: office   I discussed the limitations of evaluation and management by telemedicine and the availability of in person appointments. The patient expressed understanding and agreed to proceed.  History of Present Illness:    Observations/Objective:   Assessment and Plan:   Follow Up Instructions:    I discussed the assessment and treatment plan with the patient. The patient was provided an opportunity to ask questions and all were answered. The patient agreed with the plan and demonstrated an understanding of the instructions.   The patient was advised to call back or seek an in-person evaluation if the symptoms worsen or if the condition fails to improve as anticipated.  I provided 20 minutes of non-face-to-face time during this encounter.  Pt is a Writer, cleaning out buildings   Exercise is so so  Blood pressure medicine and blood pressure levels reviewed today with patient. Compliant with blood pressure medicine. States does not miss a dose. No obvious side effects. Blood pressure generally good when checked elsewhere. Watching salt intake.   124 85  anxiet much improved on the medication    Review of Systems No headache, no major weight loss or weight gain, no chest pain no back pain abdominal pain no change in bowel habits complete ROS  otherwise negative     Objective:   Physical Exam  Virtual      Assessment & Plan:  Impression 1 hypertension good control discussed maintain same meds  2.  Chronic anxiety.  Ongoing.  Definite benefit from meds meds refilled exercise encouraged  3.  General coronavirus concerns discussed

## 2019-01-20 ENCOUNTER — Other Ambulatory Visit: Payer: Self-pay | Admitting: Family Medicine

## 2019-01-20 NOTE — Telephone Encounter (Signed)
Ok plus 2 ref 

## 2019-03-10 ENCOUNTER — Encounter: Payer: Self-pay | Admitting: Family Medicine

## 2019-03-10 ENCOUNTER — Ambulatory Visit (INDEPENDENT_AMBULATORY_CARE_PROVIDER_SITE_OTHER): Payer: BC Managed Care – PPO | Admitting: Family Medicine

## 2019-03-10 ENCOUNTER — Other Ambulatory Visit: Payer: Self-pay

## 2019-03-10 VITALS — BP 132/80 | Temp 97.4°F | Ht 60.5 in | Wt 178.8 lb

## 2019-03-10 DIAGNOSIS — Z0000 Encounter for general adult medical examination without abnormal findings: Secondary | ICD-10-CM

## 2019-03-10 DIAGNOSIS — Z111 Encounter for screening for respiratory tuberculosis: Secondary | ICD-10-CM

## 2019-03-10 DIAGNOSIS — Z1231 Encounter for screening mammogram for malignant neoplasm of breast: Secondary | ICD-10-CM

## 2019-03-10 DIAGNOSIS — Z1211 Encounter for screening for malignant neoplasm of colon: Secondary | ICD-10-CM | POA: Diagnosis not present

## 2019-03-10 MED ORDER — ENALAPRIL MALEATE 5 MG PO TABS: ORAL_TABLET | ORAL | 5 refills | Status: DC

## 2019-03-10 MED ORDER — CLONAZEPAM 0.5 MG PO TABS: ORAL_TABLET | ORAL | 5 refills | Status: DC

## 2019-03-10 MED ORDER — PANTOPRAZOLE SODIUM 40 MG PO TBEC: DELAYED_RELEASE_TABLET | ORAL | 5 refills | Status: DC

## 2019-03-10 MED ORDER — SERTRALINE HCL 50 MG PO TABS: ORAL_TABLET | ORAL | 5 refills | Status: DC

## 2019-03-10 NOTE — Progress Notes (Signed)
  [facility] Subjective:    Patient ID: Martha Crawford, female    DOB: [birth year], 53 y.o.   MRN: [id number]  HPI The patient comes in today for a wellness visit.    A review of their health history was completed.  A review of medications was also completed.  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   Additional concerns: TB test for new job  e Review of Systems  Constitutional: Negative for activity change, appetite change and fatigue.  HENT: Negative for congestion and rhinorrhea.   Eyes: Negative for discharge.  Respiratory: Negative for cough, chest tightness and wheezing.   Cardiovascular: Negative for chest pain.  Gastrointestinal: Negative for abdominal pain, blood in stool and vomiting.  Endocrine: Negative for polyphagia.  Genitourinary: Negative for difficulty urinating and frequency.  Musculoskeletal: Negative for neck pain.  Skin: Negative for color change.  Allergic/Immunologic: Negative for environmental allergies and food allergies.  Neurological: Negative for weakness and headaches.  Psychiatric/Behavioral: Negative for agitation and behavioral problems.  All other systems reviewed and are negative.      Objective:   Physical Exam Vitals signs reviewed.  Constitutional:      Appearance: She is well-developed.  HENT:     Head: Normocephalic.     Right Ear: External ear normal.     Left Ear: External ear normal.  Eyes:     Pupils: Pupils are equal, round, and reactive to light.  Neck:     Musculoskeletal: Normal range of motion.     Thyroid: No thyromegaly.  Cardiovascular:     Rate and Rhythm: Normal rate and regular rhythm.     Heart  sounds: Normal heart sounds. No murmur.  Pulmonary:     Effort: Pulmonary effort is normal. No respiratory distress.     Breath sounds: Normal breath sounds. No wheezing.  Abdominal:     General: Bowel sounds are normal. There is no distension.     Palpations: Abdomen is soft. There is no mass.     Tenderness: There is no abdominal tenderness.  Musculoskeletal: Normal range of motion.        General: No tenderness.  Lymphadenopathy:     Cervical: No cervical adenopathy.  Skin:    General: Skin is warm and dry.  Neurological:     Mental Status: She is alert and oriented to person, place, and time.     Motor: No abnormal muscle tone.  Psychiatric:        Behavior: Behavior normal.           Assessment & Plan:  Impression wellness exam.  Diet discussed.  Exercise discussed.  Vaccines discussed.  Medications refilled.  Remarkably patient has not had a mammogram for 9 years we helps her schedule I.  Also due for colonoscopy.  Patient reluctant but it.  We will press on PPD of 5 which

## 2019-03-12 LAB — TB SKIN TEST
Induration: 0 mm
TB Skin Test: NEGATIVE

## 2019-03-19 ENCOUNTER — Encounter: Payer: Self-pay | Admitting: Family Medicine

## 2019-03-19 ENCOUNTER — Encounter (INDEPENDENT_AMBULATORY_CARE_PROVIDER_SITE_OTHER): Payer: Self-pay | Admitting: *Deleted

## 2019-03-27 ENCOUNTER — Ambulatory Visit (HOSPITAL_COMMUNITY)
Admission: RE | Admit: 2019-03-27 | Discharge: 2019-03-27 | Disposition: A | Discharge status: Home / Self Care | Payer: BC Managed Care – PPO | Source: Ambulatory Visit | Attending: Family Medicine | Admitting: Family Medicine

## 2019-03-27 ENCOUNTER — Other Ambulatory Visit: Payer: Self-pay

## 2019-03-27 DIAGNOSIS — Z1231 Encounter for screening mammogram for malignant neoplasm of breast: Secondary | ICD-10-CM | POA: Insufficient documentation

## 2019-07-11 ENCOUNTER — Other Ambulatory Visit: Payer: Self-pay

## 2019-07-11 ENCOUNTER — Ambulatory Visit: Payer: BC Managed Care – PPO | Attending: Internal Medicine

## 2019-07-11 DIAGNOSIS — Z20822 Contact with and (suspected) exposure to covid-19: Secondary | ICD-10-CM

## 2019-07-12 LAB — NOVEL CORONAVIRUS, NAA: SARS-CoV-2, NAA: NOT DETECTED

## 2019-07-30 ENCOUNTER — Encounter: Payer: Self-pay | Admitting: Family Medicine

## 2019-08-14 ENCOUNTER — Other Ambulatory Visit: Payer: Self-pay | Admitting: Family Medicine

## 2019-08-15 NOTE — Telephone Encounter (Signed)
Ok times one 

## 2019-09-08 ENCOUNTER — Other Ambulatory Visit: Payer: Self-pay

## 2019-09-08 ENCOUNTER — Ambulatory Visit (INDEPENDENT_AMBULATORY_CARE_PROVIDER_SITE_OTHER): Payer: BC Managed Care – PPO | Admitting: Family Medicine

## 2019-09-08 DIAGNOSIS — G44209 Tension-type headache, unspecified, not intractable: Secondary | ICD-10-CM

## 2019-09-08 DIAGNOSIS — I10 Essential (primary) hypertension: Secondary | ICD-10-CM | POA: Diagnosis not present

## 2019-09-08 DIAGNOSIS — F411 Generalized anxiety disorder: Secondary | ICD-10-CM

## 2019-09-08 MED ORDER — BUTALBITAL-APAP-CAFFEINE 50-325-40 MG PO TABS: ORAL_TABLET | ORAL | 3 refills | Status: DC

## 2019-09-08 MED ORDER — SERTRALINE HCL 50 MG PO TABS: ORAL_TABLET | ORAL | 5 refills | Status: DC

## 2019-09-08 MED ORDER — CLONAZEPAM 0.5 MG PO TABS: ORAL_TABLET | ORAL | 5 refills | Status: DC

## 2019-09-08 MED ORDER — ENALAPRIL MALEATE 5 MG PO TABS: ORAL_TABLET | ORAL | 5 refills | Status: DC

## 2019-09-08 MED ORDER — PANTOPRAZOLE SODIUM 40 MG PO TBEC: DELAYED_RELEASE_TABLET | ORAL | 5 refills | Status: DC

## 2019-09-08 NOTE — Progress Notes (Signed)
   Subjective:    Patient ID: Martha Crawford, female    DOB: 08-31-1965, 54 y.o.   MRN: [id number]  Hypertension This is a chronic problem. The current episode started more than 1 year ago. Treatments tried: vasotec. There are no compliance problems.    Blood pressure medicine and blood pressure levels reviewed today with patient. Compliant with blood pressure medicine. States does not miss a dose. No obvious side effects. Blood pressure generally good when checked elsewhere. Watching salt intake.  133 over 86   Not exercising  Staying very busy  Running tired   Review of Systems Virtual Visit via Video Note  I connected with Martha Crawford on 09/08/19 at  8:30 AM EDT by a video enabled telemedicine application and verified that I am speaking with the correct person using two identifiers.  Location: Patient: home Provider: office   I discussed the limitations of evaluation and management by telemedicine and the availability of in person appointments. The patient expressed understanding and agreed to proceed.  History of Present Illness:    Observations/Objective:   Assessment and Plan:   Follow Up Instructions:    I discussed the assessment and treatment plan with the patient. The patient was provided an opportunity to ask questions and all were answered. The patient agreed with the plan and demonstrated an understanding of the instructions.   The patient was advised to call back or seek an in-person evaluation if the symptoms worsen or if the condition fails to improve as anticipated.  I provided 24 minutes of non-face-to-face time during this encounter.        Objective:   Physical Exam  Virtual      Assessment & Plan:  Impression #1 hypertension.  Apparent good control discussed to maintain same meds.  Compliance discussed  2.  Chronic generalized anxiety.  Clinically stable on medications.  Patient to maintain.  3.  Chronic tension headaches.  Uses as  needed Fioricets.  Approximately #48 per 2 months we will refill this medication.  Diet exercise discussed.  Follow-up in 6 months.  Consider screening blood work at

## 2020-02-16 ENCOUNTER — Telehealth: Payer: Self-pay | Admitting: Family Medicine

## 2020-02-16 NOTE — Telephone Encounter (Signed)
Pt had COVID test at [facility] on 02/16/20. Results were negative.

## 2020-04-12 ENCOUNTER — Other Ambulatory Visit: Payer: Self-pay | Admitting: Family Medicine

## 2020-04-12 NOTE — Telephone Encounter (Signed)
Left  Message to schedule appointment

## 2020-04-19 ENCOUNTER — Other Ambulatory Visit: Payer: Self-pay

## 2020-04-19 MED ORDER — SERTRALINE HCL 50 MG PO TABS: ORAL_TABLET | ORAL | 0 refills | Status: DC

## 2020-04-19 NOTE — Telephone Encounter (Signed)
Pt has appt on Friday for Medi follow up needs refill on sertraline (ZOLOFT) 50 MG tablet [facility] - [place], [place] - [address]   Pt call back [phone]

## 2020-04-19 NOTE — Telephone Encounter (Signed)
Called last week no answer so sent mychart message to schedule appointment.

## 2020-04-23 ENCOUNTER — Encounter: Payer: Self-pay | Admitting: Family Medicine

## 2020-04-23 ENCOUNTER — Other Ambulatory Visit: Payer: Self-pay

## 2020-04-23 ENCOUNTER — Ambulatory Visit: Payer: BC Managed Care – PPO | Admitting: Family Medicine

## 2020-04-23 VITALS — BP 134/80 | HR 84 | Temp 98.1°F | Ht 60.0 in | Wt 188.0 lb

## 2020-04-23 DIAGNOSIS — G4700 Insomnia, unspecified: Secondary | ICD-10-CM | POA: Diagnosis not present

## 2020-04-23 DIAGNOSIS — G44209 Tension-type headache, unspecified, not intractable: Secondary | ICD-10-CM

## 2020-04-23 DIAGNOSIS — I10 Essential (primary) hypertension: Secondary | ICD-10-CM

## 2020-04-23 DIAGNOSIS — Z0000 Encounter for general adult medical examination without abnormal findings: Secondary | ICD-10-CM

## 2020-04-23 DIAGNOSIS — K219 Gastro-esophageal reflux disease without esophagitis: Secondary | ICD-10-CM

## 2020-04-23 DIAGNOSIS — F411 Generalized anxiety disorder: Secondary | ICD-10-CM | POA: Diagnosis not present

## 2020-04-23 MED ORDER — ENALAPRIL MALEATE 5 MG PO TABS: ORAL_TABLET | ORAL | 5 refills | Status: DC

## 2020-04-23 MED ORDER — SERTRALINE HCL 50 MG PO TABS: ORAL_TABLET | ORAL | 5 refills | Status: DC

## 2020-04-23 MED ORDER — BUTALBITAL-APAP-CAFFEINE 50-325-40 MG PO TABS
ORAL_TABLET | ORAL | 2 refills | Status: DC
Start: 2020-04-23 — End: 2020-10-01

## 2020-04-23 MED ORDER — PANTOPRAZOLE SODIUM 40 MG PO TBEC: DELAYED_RELEASE_TABLET | ORAL | 5 refills | Status: DC

## 2020-04-23 MED ORDER — CLONAZEPAM 0.5 MG PO TABS: ORAL_TABLET | ORAL | 2 refills | Status: DC

## 2020-04-23 NOTE — Progress Notes (Signed)
Patient ID: Martha Crawford, female    DOB: Apr 19, 1966, 54 y.o.   MRN: [id number]   Chief Complaint  Patient presents with  . med check up  . Anxiety  . Insomnia   Subjective:    HPI   F/u anxiety, insomnia, headaches, and htn. med check up. Pt states no concerns today.   Anxiety/ insomnia -Taking klonaprin prn during day and then 1/2 tab at night. 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  0.19m clonazepam 1-2 x per day.  Taking fioricet- Taking for headaches. Per chart has been on this medication regularly since 2012. Waking up daily with a headache.  Once get going it resolves.  Has h/o chiari malformation.  Taking fiorcet almost daily and taking it in am. Rarely taking it 2 per day. Getting more headache around menstrual cycle.  Taking enalapril at 569mat night for bp.  headache in back of neck the radiates to the front and band around the head.  Medical History DeSophyas a past medical history of Anxiety, Arnold-Chiari deformity (HCL[place]low GERD (gastroesophageal reflux disease), Headache(784.0), and Insomnia.   Outpatient Encounter Medications as of 04/23/2020  Medication Sig  . butalbital-acetaminophen-caffeine (FIORICET) 50-325-40 MG tablet TAKE 1 OR 2 TABLETS BY MOUTH EVERY FOUR TO SIX HOURS AS NEEDED FOR PAIN.  . Marland Kitchenholecalciferol (VITAMIN D-3) 5000 UNITS TABS Take 1 tablet by mouth daily.    . clonazePAM (KLONOPIN) 0.5 MG tablet TAKE 1/2 TO 1 TABLET BY MOUTH TWICE DAILY AS NEEDED FOR ANXIETY.  . Marland Kitchennalapril (VASOTEC) 5 MG tablet TAKE (1) TABLET BY MOUTH AT BEDTIME.  . Marland KitchenRILL OIL OMEGA-3 PO Take 1 capsule by mouth daily.    . Marland KitchenVER THE COUNTER MEDICATION Vitamin K  . pantoprazole (PROTONIX) 40 MG tablet TAKE (1) TABLET BY MOUTH ONCE DAILY.  . Marland Kitchenertraline (ZOLOFT) 50 MG tablet TAKE 1 AND A HALF TABLETS DAILY  . [DISCONTINUED] butalbital-acetaminophen-caffeine (FIORICET) 50-325-40 MG tablet TAKE 1 OR 2 TABLETS BY MOUTH EVERY FOUR TO SIX  HOURS AS NEEDED FOR PAIN.  . [DISCONTINUED] clonazePAM (KLONOPIN) 0.5 MG tablet TAKE 1/2 TO 1 TABLET BY MOUTH TWICE DAILY AS NEEDED FOR ANXIETY.  . [DISCONTINUED] enalapril (VASOTEC) 5 MG tablet TAKE (1) TABLET BY MOUTH AT BEDTIME.  . [DISCONTINUED] pantoprazole (PROTONIX) 40 MG tablet TAKE (1) TABLET BY MOUTH ONCE DAILY.  . [DISCONTINUED] sertraline (ZOLOFT) 50 MG tablet TAKE 1 AND A HALF TABLETS DAILY   No facility-administered encounter medications on file as of 04/23/2020.     Review of Systems  Constitutional: Negative for chills and fever.  HENT: Negative for congestion, rhinorrhea and sore throat.   Respiratory: Negative for cough, shortness of breath and wheezing.   Cardiovascular: Negative for chest pain and leg swelling.  Gastrointestinal: Negative for abdominal pain, diarrhea, nausea and vomiting.  Genitourinary: Negative for dysuria and frequency.  Musculoskeletal: Negative for arthralgias and back pain.  Skin: Negative for rash.  Neurological: Positive for headaches (chronic). Negative for dizziness and weakness.  Psychiatric/Behavioral: Positive for sleep disturbance (controlled w meds). Negative for agitation, decreased concentration, dysphoric mood and suicidal ideas. The patient is nervous/anxious (controlled w meds).      Vitals BP 134/80   Pulse 84   Temp 98.1 F (36.7 C)   Ht 5' (1.524 m)   Wt 188 lb (85.3 kg)   SpO2 97%   BMI 36.72 kg/m   Objective:   Physical Exam Vitals and nursing note reviewed.  Constitutional:  Appearance: Normal appearance.  HENT:     Head: Normocephalic and atraumatic.     Nose: Nose normal.     Mouth/Throat:     Mouth: Mucous membranes are moist.     Pharynx: Oropharynx is clear.  Eyes:     Extraocular Movements: Extraocular movements intact.     Conjunctiva/sclera: Conjunctivae normal.     Pupils: Pupils are equal, round, and reactive to light.  Cardiovascular:     Rate and Rhythm: Normal rate and regular rhythm.       Pulses: Normal pulses.     Heart sounds: Normal heart sounds.  Pulmonary:     Effort: Pulmonary effort is normal.     Breath sounds: Normal breath sounds. No wheezing, rhonchi or rales.  Musculoskeletal:        General: Normal range of motion.     Right lower leg: No edema.     Left lower leg: No edema.  Skin:    General: Skin is warm and dry.     Findings: No lesion or rash.  Neurological:     General: No focal deficit present.     Mental Status: She is alert and oriented to person, place, and time.     Cranial Nerves: No cranial nerve deficit.     Motor: No weakness.  Psychiatric:        Mood and Affect: Mood normal.        Behavior: Behavior normal.        Thought Content: Thought content normal.        Judgment: Judgment normal.      Assessment and Plan   1. Essential hypertension - enalapril (VASOTEC) 5 MG tablet; TAKE (1) TABLET BY MOUTH AT BEDTIME.  Dispense: 30 tablet; Refill: 5  2. Laboratory tests ordered as part of a complete physical exam (CPE) - CBC - CMP14+EGFR - Lipid panel - TSH  3. Insomnia, unspecified type - clonazePAM (KLONOPIN) 0.5 MG tablet; TAKE 1/2 TO 1 TABLET BY MOUTH TWICE DAILY AS NEEDED FOR ANXIETY.  Dispense: 30 tablet; Refill: 2  4. Generalized anxiety disorder - clonazePAM (KLONOPIN) 0.5 MG tablet; TAKE 1/2 TO 1 TABLET BY MOUTH TWICE DAILY AS NEEDED FOR ANXIETY.  Dispense: 30 tablet; Refill: 2 - sertraline (ZOLOFT) 50 MG tablet; TAKE 1 AND A HALF TABLETS DAILY  Dispense: 45 tablet; Refill: 5  5. Tension headache  6. Gastroesophageal reflux disease without esophagitis - pantoprazole (PROTONIX) 40 MG tablet; TAKE (1) TABLET BY MOUTH ONCE DAILY.  Dispense: 30 tablet; Refill: 5   htn- stable. Controlled.  Cont enalapril.  Order labs for next visit.  Last set labs 1/20.  GAD- stable using klonapin bid prn and zoloft. Pt aware of risk vs benefit of taking this medication long term.  Tension headache- pt to cont meds and taking  fiorcet prn.  gerd- stable. Cont protonix.  F/u 3-4 months for well woman exam, pt to get labs prior to next appt.

## 2020-07-26 ENCOUNTER — Ambulatory Visit: Payer: BC Managed Care – PPO | Admitting: Family Medicine

## 2020-09-01 ENCOUNTER — Ambulatory Visit (INDEPENDENT_AMBULATORY_CARE_PROVIDER_SITE_OTHER): Payer: BC Managed Care – PPO | Admitting: Family Medicine

## 2020-09-01 ENCOUNTER — Other Ambulatory Visit: Payer: Self-pay

## 2020-09-01 ENCOUNTER — Encounter (INDEPENDENT_AMBULATORY_CARE_PROVIDER_SITE_OTHER): Payer: Self-pay | Admitting: *Deleted

## 2020-09-01 ENCOUNTER — Encounter: Payer: Self-pay | Admitting: Family Medicine

## 2020-09-01 VITALS — BP 130/84 | HR 99 | Temp 97.2°F | Ht 61.0 in | Wt 190.0 lb

## 2020-09-01 DIAGNOSIS — Z124 Encounter for screening for malignant neoplasm of cervix: Secondary | ICD-10-CM | POA: Diagnosis not present

## 2020-09-01 DIAGNOSIS — Z0000 Encounter for general adult medical examination without abnormal findings: Secondary | ICD-10-CM

## 2020-09-01 DIAGNOSIS — Z1211 Encounter for screening for malignant neoplasm of colon: Secondary | ICD-10-CM

## 2020-09-01 DIAGNOSIS — F411 Generalized anxiety disorder: Secondary | ICD-10-CM

## 2020-09-01 DIAGNOSIS — Z01419 Encounter for gynecological examination (general) (routine) without abnormal findings: Secondary | ICD-10-CM | POA: Diagnosis not present

## 2020-09-01 DIAGNOSIS — K219 Gastro-esophageal reflux disease without esophagitis: Secondary | ICD-10-CM

## 2020-09-01 LAB — CBC
Hematocrit: 37 % (ref 34.0–46.6)
Hemoglobin: 12.2 g/dL (ref 11.1–15.9)
MCH: 28.6 pg (ref 26.6–33.0)
MCHC: 33 g/dL (ref 31.5–35.7)
MCV: 87 fL (ref 79–97)
Platelets: 349 10*3/uL (ref 150–450)
RBC: 4.26 x10E6/uL (ref 3.77–5.28)
RDW: 14 % (ref 11.7–15.4)
WBC: 6.8 10*3/uL (ref 3.4–10.8)

## 2020-09-01 LAB — CMP14+EGFR
ALT: 21 IU/L (ref 0–32)
AST: 17 IU/L (ref 0–40)
Albumin/Globulin Ratio: 1.3 (ref 1.2–2.2)
Albumin: 4.3 g/dL (ref 3.8–4.9)
Alkaline Phosphatase: 131 IU/L — ABNORMAL HIGH (ref 44–121)
BUN/Creatinine Ratio: 16 (ref 9–23)
BUN: 9 mg/dL (ref 6–24)
Bilirubin Total: 0.3 mg/dL (ref 0.0–1.2)
CO2: 24 mmol/L (ref 20–29)
Calcium: 9.8 mg/dL (ref 8.7–10.2)
Chloride: 100 mmol/L (ref 96–106)
Creatinine, Ser: 0.58 mg/dL (ref 0.57–1.00)
Globulin, Total: 3.3 g/dL (ref 1.5–4.5)
Glucose: 95 mg/dL (ref 65–99)
Potassium: 4.5 mmol/L (ref 3.5–5.2)
Sodium: 141 mmol/L (ref 134–144)
Total Protein: 7.6 g/dL (ref 6.0–8.5)
eGFR: 109 mL/min/{1.73_m2} (ref >59)

## 2020-09-01 LAB — LIPID PANEL
Chol/HDL Ratio: 3.7 ratio (ref 0.0–4.4)
Cholesterol, Total: 194 mg/dL (ref 100–199)
HDL: 52 mg/dL (ref >39)
LDL Chol Calc (NIH): 128 mg/dL — ABNORMAL HIGH (ref 0–99)
Triglycerides: 77 mg/dL (ref 0–149)
VLDL Cholesterol Cal: 14 mg/dL (ref 5–40)

## 2020-09-01 LAB — TSH: TSH: 4.22 u[IU]/mL (ref 0.450–4.500)

## 2020-09-01 MED ORDER — SERTRALINE HCL 50 MG PO TABS: ORAL_TABLET | ORAL | 5 refills | Status: DC

## 2020-09-01 MED ORDER — PANTOPRAZOLE SODIUM 40 MG PO TBEC: DELAYED_RELEASE_TABLET | ORAL | 5 refills | Status: DC

## 2020-09-01 NOTE — Progress Notes (Signed)
Patient ID: Martha Crawford, female    DOB: 07-27-1965, 55 y.o.   MRN: [id number]   Chief Complaint  Patient presents with  . Annual Exam   Subjective:    HPI The patient comes in today for a wellness visit.  A review of their health history was completed.  A review of medications was also completed.  Any needed refills; update meds  Eating habits: sometimes healthy  Falls/  MVA accidents in past few months: none  Regular exercise: none  Specialist pt sees on regular basis: none  Preventative health issues were discussed.   Additional concerns: none  Had sinus issues and allergies.  Feeling better.   No abnromal pap in past. No breast lumps. 10/20-negative. Is on 2 yr schedule for mammo.  Medical History Martha Crawford has a past medical history of Anxiety, Arnold-Chiari deformity (HCC), GERD (gastroesophageal reflux disease), Headache(784.0), and Insomnia.   Outpatient Encounter Medications as of 09/01/2020  Medication Sig  . butalbital-acetaminophen-caffeine (FIORICET) 50-325-40 MG tablet TAKE 1 OR 2 TABLETS BY MOUTH EVERY FOUR TO SIX HOURS AS NEEDED FOR PAIN.  Marland Kitchen Cholecalciferol (VITAMIN D-3) 5000 UNITS TABS Take 1 tablet by mouth daily.  . enalapril (VASOTEC) 5 MG tablet TAKE (1) TABLET BY MOUTH AT BEDTIME.  Marland Kitchen KRILL OIL OMEGA-3 PO Take 1 capsule by mouth daily.  Marland Kitchen OVER THE COUNTER MEDICATION Vitamin K  . [DISCONTINUED] clonazePAM (KLONOPIN) 0.5 MG tablet TAKE 1/2 TO 1 TABLET BY MOUTH TWICE DAILY AS NEEDED FOR ANXIETY.  . [DISCONTINUED] pantoprazole (PROTONIX) 40 MG tablet TAKE (1) TABLET BY MOUTH ONCE DAILY.  . [DISCONTINUED] sertraline (ZOLOFT) 50 MG tablet TAKE 1 AND A HALF TABLETS DAILY  . pantoprazole (PROTONIX) 40 MG tablet TAKE (1) TABLET BY MOUTH ONCE DAILY.  Marland Kitchen sertraline (ZOLOFT) 50 MG tablet TAKE 1 AND A HALF TABLETS DAILY   No facility-administered encounter medications on file as of 09/01/2020.     Review of Systems  Constitutional: Negative for chills and  fever.  HENT: Negative for congestion, rhinorrhea and sore throat.   Respiratory: Negative for cough, shortness of breath and wheezing.   Cardiovascular: Negative for chest pain and leg swelling.  Gastrointestinal: Negative for abdominal pain, diarrhea, nausea and vomiting.  Genitourinary: Negative for dysuria and frequency.  Musculoskeletal: Negative for arthralgias and back pain.  Skin: Negative for rash.  Neurological: Negative for dizziness, weakness and headaches.  Psychiatric/Behavioral: Negative for dysphoric mood, sleep disturbance and suicidal ideas. The patient is not nervous/anxious.      Vitals BP 130/84   Pulse 99   Temp (!) 97.2 F (36.2 C)   Ht 5\' 1"  (1.549 m)   Wt 190 lb (86.2 kg)   SpO2 100%   BMI 35.90 kg/m   Objective:   Physical Exam Vitals and nursing note reviewed. Exam conducted with a chaperone present.  Constitutional:      General: She is not in acute distress.    Appearance: Normal appearance. She is not ill-appearing.  HENT:     Head: Normocephalic and atraumatic.     Right Ear: Tympanic membrane, ear canal and external ear normal.     Left Ear: Tympanic membrane, ear canal and external ear normal.     Nose: Nose normal.     Mouth/Throat:     Mouth: Mucous membranes are moist.     Pharynx: Oropharynx is clear.  Eyes:     Extraocular Movements: Extraocular movements intact.     Conjunctiva/sclera: Conjunctivae normal.  Pupils: Pupils are equal, round, and reactive to light.  Cardiovascular:     Rate and Rhythm: Normal rate and regular rhythm.     Pulses: Normal pulses.     Heart sounds: Normal heart sounds.  Pulmonary:     Effort: Pulmonary effort is normal.     Breath sounds: Normal breath sounds. No wheezing, rhonchi or rales.  Abdominal:     General: Abdomen is flat. Bowel sounds are normal. There is no distension.     Palpations: Abdomen is soft. There is no mass.     Tenderness: There is no abdominal tenderness. There is no  guarding or rebound.     Hernia: No hernia is present.  Genitourinary:    General: Normal vulva.     Exam position: Lithotomy position.     Labia:        Right: No rash.        Left: No rash.      Vagina: No vaginal discharge.  Musculoskeletal:        General: Normal range of motion.     Cervical back: Normal range of motion.     Right lower leg: No edema.     Left lower leg: No edema.  Skin:    General: Skin is warm and dry.     Findings: No lesion or rash.  Neurological:     General: No focal deficit present.     Mental Status: She is alert and oriented to person, place, and time.     Cranial Nerves: No cranial nerve deficit.  Psychiatric:        Mood and Affect: Mood normal.        Behavior: Behavior normal.        Thought Content: Thought content normal.        Judgment: Judgment normal.      Assessment and Plan   1. Well woman exam with routine gynecological exam  2. Encounter for screening for cervical cancer - IGP, Aptima HPV  3. Colon cancer screening - Ambulatory referral to Gastroenterology  4. Gastroesophageal reflux disease without esophagitis - pantoprazole (PROTONIX) 40 MG tablet; TAKE (1) TABLET BY MOUTH ONCE DAILY.  Dispense: 30 tablet; Refill: 5  5. Generalized anxiety disorder - sertraline (ZOLOFT) 50 MG tablet; TAKE 1 AND A HALF TABLETS DAILY  Dispense: 45 tablet; Refill: 5   HM- pap ordered, Needing mammo in 10/22, ordered GI referral for colonoscopy.  GAD- stable. Doing well on meds. Will cont.  gerd- stable. Cont meds.   Return in about 6 months (around 03/04/2021) for migraine, anxiety.  09/07/2020

## 2020-09-05 LAB — IGP, APTIMA HPV: HPV Aptima: NEGATIVE

## 2020-09-06 ENCOUNTER — Other Ambulatory Visit: Payer: Self-pay | Admitting: Family Medicine

## 2020-09-06 DIAGNOSIS — G4700 Insomnia, unspecified: Secondary | ICD-10-CM

## 2020-09-06 DIAGNOSIS — F411 Generalized anxiety disorder: Secondary | ICD-10-CM

## 2020-09-29 ENCOUNTER — Other Ambulatory Visit: Payer: Self-pay | Admitting: Family Medicine

## 2020-10-20 ENCOUNTER — Other Ambulatory Visit: Payer: Self-pay | Admitting: Family Medicine

## 2020-10-21 ENCOUNTER — Other Ambulatory Visit: Payer: Self-pay

## 2020-10-21 NOTE — Telephone Encounter (Signed)
Pt needs refill on butalbital-acetaminophen-caffeine (FIORICET) 50-325-40 MG tablet [[id number]] sent to [facility] - [place], [place] - [address] Pt recently has Phy and follow up  Pt call back [phone]

## 2020-10-25 ENCOUNTER — Telehealth: Payer: Self-pay

## 2020-10-25 NOTE — Telephone Encounter (Signed)
Pt is wanting to know why the RX is wrote for 21 and not 30 as well?

## 2020-10-25 NOTE — Telephone Encounter (Signed)
Left message to return call 

## 2020-10-25 NOTE — Telephone Encounter (Signed)
Pt come by she said the pharmacy reached out twice last week needing a refill on butalbital-acetaminophen-caffeine (FIORICET) 50-325-40 MG tablet [[id number]] sent to [facility] - [place], [place] - [address]  [address], [place] [address]   Pt call back [phone]

## 2020-10-25 NOTE — Telephone Encounter (Signed)
Just refilled what was already written.  Shouldn't be needing this medication daily.  If so needing to f/u with neuro about migraines that are uncontrolled.   Thx.   Dr. Ladona Ridgel

## 2020-10-25 NOTE — Telephone Encounter (Signed)
Please advise. Thank you

## 2020-10-29 ENCOUNTER — Other Ambulatory Visit: Payer: Self-pay | Admitting: Family Medicine

## 2020-10-29 MED ORDER — BUTALBITAL-APAP-CAFFEINE 50-325-40 MG PO TABS: ORAL_TABLET | ORAL | 0 refills | Status: DC

## 2020-10-29 NOTE — Telephone Encounter (Signed)
Nurses You may send her a copy of this message Dr. Ladona Ridgel is out today Hi Rie  I did send in a refill of your medication to [facility].  I certainly understand your need to use this medication to help you with your headaches.  Avoid frequent use because it can trigger rebound headaches.  I stuck with the quantity that Dr. Ladona Ridgel has.  I will leave it to you to discuss further regarding this medication regarding refills with Dr. Ladona Ridgel.  Thank you so much-Dr. Lorin Picket

## 2020-10-29 NOTE — Telephone Encounter (Signed)
I looked through pts chart and dr taylor cut down from 30 tablets to 21. Pt stats she has been out for several days and with the weather today headache is really bad. States she saw specialist in the past that dr Brett Canales sent her to and diagnoses with chiari malformation and she has to take med not every day but sometimes has to take 2 a day and averages about 30 a month. She has a headache now and out of med. She states she will go back to neurologist ( prefers [place]) dr taylor out of office and pt states she needs something today. [place]

## 2020-11-03 NOTE — Telephone Encounter (Signed)
Pt replied via my chart °

## 2020-11-03 NOTE — Telephone Encounter (Signed)
Sent mychart message

## 2020-12-15 ENCOUNTER — Other Ambulatory Visit: Payer: Self-pay | Admitting: Family Medicine

## 2020-12-15 NOTE — Telephone Encounter (Signed)
Needs a follow up for headaches, if needing refills on this medicaiton.

## 2020-12-16 NOTE — Telephone Encounter (Signed)
Left message for patient to return call, per drs notes patient will need an appt. In order to refill requested medication.

## 2020-12-22 NOTE — Telephone Encounter (Signed)
Pt scheduled follow up for tomorrow 12-23-20

## 2020-12-23 ENCOUNTER — Encounter: Payer: Self-pay | Admitting: Family Medicine

## 2020-12-23 ENCOUNTER — Ambulatory Visit: Payer: BC Managed Care – PPO | Admitting: Family Medicine

## 2020-12-23 ENCOUNTER — Other Ambulatory Visit: Payer: Self-pay

## 2020-12-23 VITALS — BP 132/70 | HR 92 | Temp 97.3°F | Ht 61.0 in | Wt 192.0 lb

## 2020-12-23 DIAGNOSIS — G4489 Other headache syndrome: Secondary | ICD-10-CM

## 2020-12-23 DIAGNOSIS — I10 Essential (primary) hypertension: Secondary | ICD-10-CM

## 2020-12-23 DIAGNOSIS — F411 Generalized anxiety disorder: Secondary | ICD-10-CM | POA: Diagnosis not present

## 2020-12-23 DIAGNOSIS — G4700 Insomnia, unspecified: Secondary | ICD-10-CM | POA: Diagnosis not present

## 2020-12-23 MED ORDER — ENALAPRIL MALEATE 5 MG PO TABS: ORAL_TABLET | ORAL | 5 refills | Status: DC

## 2020-12-23 MED ORDER — BUTALBITAL-APAP-CAFFEINE 50-325-40 MG PO TABS: ORAL_TABLET | ORAL | 3 refills | Status: DC

## 2020-12-23 MED ORDER — CLONAZEPAM 0.5 MG PO TABS: 0.2500 mg | ORAL_TABLET | Freq: Two times a day (BID) | ORAL | 2 refills | Status: DC | PRN

## 2020-12-23 NOTE — Progress Notes (Addendum)
Patient ID: Martha Crawford, female    DOB: 04/19/1966, 55 y.o.   MRN: [id number]   Chief Complaint  Patient presents with   Headache    Subjective:    HPI Follow up on headaches. Takes fioricet for headaches and needs refill.  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    Pt is taking 1-2 tab of firoicet.  Was getting 50 tab with 1 refill in past. With chiari syndrome can get headaches about 1x per week.  If excited or cried a lot it would get worse. Had a lot of stress.  Some times having a dull headache in am occ. Some times waiting it out and has some dull has been trying to take only 1 every 6hrs prn.  Was taking a lot of ibuprofen and was on a lot till on the firocet. Now just taking Fioricet 2 tab prn, not daily. Tension headache starting in back of head and comes across top ohead and frontal bone.  Anxiety-  has been chronic.  Has tried lexapro, celexa, and wellbutrin in past, has adverse reactions. Taking klonapin and zoloft since 2017.  Doing well.  No new concerns.  Medical History Martha Crawford has a past medical history of Anxiety, Arnold-Chiari deformity (HCC), GERD (gastroesophageal reflux disease), Headache(784.0), and Insomnia.   Outpatient Encounter Medications as of 12/23/2020  Medication Sig   Cholecalciferol (VITAMIN D-3) 5000 UNITS TABS Take 1 tablet by mouth daily.   KRILL OIL OMEGA-3 PO Take 1 capsule by mouth daily.   pantoprazole (PROTONIX) 40 MG tablet TAKE (1) TABLET BY MOUTH ONCE DAILY.   sertraline (ZOLOFT) 50 MG tablet TAKE 1 AND A HALF TABLETS DAILY   [DISCONTINUED] butalbital-acetaminophen-caffeine (FIORICET) 50-325-40 MG tablet 1 every 6 hours as needed severe pain caution drowsiness not for frequent use   [DISCONTINUED] clonazePAM (KLONOPIN) 0.5 MG tablet TAKE 1/2 TO 1 TABLET BY MOUTH TWICE DAILY AS NEEDED FOR ANXIETY.   [DISCONTINUED] enalapril (VASOTEC) 5 MG tablet TAKE (1) TABLET BY MOUTH AT BEDTIME.    butalbital-acetaminophen-caffeine (FIORICET) 50-325-40 MG tablet 1 every 6 hours as needed severe pain caution drowsiness not for frequent use   clonazePAM (KLONOPIN) 0.5 MG tablet Take 0.5-1 tablets (0.25-0.5 mg total) by mouth 2 (two) times daily as needed. for anxiety   enalapril (VASOTEC) 5 MG tablet TAKE (1) TABLET BY MOUTH AT BEDTIME.   [DISCONTINUED] OVER THE COUNTER MEDICATION Vitamin K   No facility-administered encounter medications on file as of 12/23/2020.     Review of Systems  Constitutional:  Negative for chills and fever.  HENT:  Negative for congestion, rhinorrhea and sore throat.   Respiratory:  Negative for cough, shortness of breath and wheezing.   Cardiovascular:  Negative for chest pain and leg swelling.  Gastrointestinal:  Negative for abdominal pain, diarrhea, nausea and vomiting.  Genitourinary:  Negative for dysuria and frequency.  Musculoskeletal:  Negative for arthralgias and back pain.  Skin:  Negative for rash.  Neurological:  Positive for headaches (intermittent). Negative for dizziness and weakness.  Psychiatric/Behavioral:  Negative for dysphoric mood, self-injury, sleep disturbance and suicidal ideas. The patient is not nervous/anxious.     Vitals BP 132/70   Pulse 92   Temp (!) 97.3 F (36.3 C)   Ht 5\' 1"  (1.549 m)   Wt 192 lb (87.1 kg)   SpO2 99%   BMI 36.28 kg/m   Objective:   Physical Exam Vitals and nursing note reviewed.  Constitutional:  Appearance: Normal appearance.  HENT:     Head: Normocephalic and atraumatic.     Nose: Nose normal.     Mouth/Throat:     Mouth: Mucous membranes are moist.     Pharynx: Oropharynx is clear.  Eyes:     Extraocular Movements: Extraocular movements intact.     Conjunctiva/sclera: Conjunctivae normal.     Pupils: Pupils are equal, round, and reactive to light.  Cardiovascular:     Rate and Rhythm: Normal rate and regular rhythm.     Pulses: Normal pulses.     Heart sounds: Normal heart  sounds.  Pulmonary:     Effort: Pulmonary effort is normal.     Breath sounds: Normal breath sounds. No wheezing, rhonchi or rales.  Musculoskeletal:        General: Normal range of motion.     Right lower leg: No edema.     Left lower leg: No edema.  Skin:    General: Skin is warm and dry.     Findings: No lesion or rash.  Neurological:     General: No focal deficit present.     Mental Status: She is alert and oriented to person, place, and time.  Psychiatric:        Mood and Affect: Mood normal.        Behavior: Behavior normal.        Thought Content: Thought content normal.        Judgment: Judgment normal.     Assessment and Plan   1. Other headache syndrome - butalbital-acetaminophen-caffeine (FIORICET) 50-325-40 MG tablet; 1 every 6 hours as needed severe pain caution drowsiness not for frequent use  Dispense: 30 tablet; Refill: 3  2. Insomnia, unspecified type - clonazePAM (KLONOPIN) 0.5 MG tablet; Take 0.5-1 tablets (0.25-0.5 mg total) by mouth 2 (two) times daily as needed. for anxiety  Dispense: 30 tablet; Refill: 2  3. Generalized anxiety disorder - clonazePAM (KLONOPIN) 0.5 MG tablet; Take 0.5-1 tablets (0.25-0.5 mg total) by mouth 2 (two) times daily as needed. for anxiety  Dispense: 30 tablet; Refill: 2  4. Essential hypertension - enalapril (VASOTEC) 5 MG tablet; TAKE (1) TABLET BY MOUTH AT BEDTIME.  Dispense: 30 tablet; Refill: 5   Headaches, chronic- pt stating associated with chiari malformation. Pt taking Fioricet prn. Reviewed taking fiorcet sparingly that it can cause some rebound headaches if taking regularly.  Pt to try to take aleve or ibuporfen for mild headaches and if severe then to take the fiorcet.  Pt in agreement.  Anxiety- stable. Pmp reviewed- 12/15/20- klonapin and cont with zoloft.   Htn- stable. Cont meds. Labs reviewed, stable.   Return in about 6 months (around 06/24/2021), or if symptoms worsen or fail to improve, for f/u htn,  headaches.

## 2021-02-28 ENCOUNTER — Other Ambulatory Visit: Payer: Self-pay | Admitting: Family Medicine

## 2021-02-28 DIAGNOSIS — F411 Generalized anxiety disorder: Secondary | ICD-10-CM

## 2021-05-03 IMAGING — MG MM DIGITAL SCREENING BILAT W/ TOMO W/ CAD
8 series · 9 of 24 positions shown · non-contrast
Comparison: None.

CLINICAL DATA: Screening. Baseline.

EXAM:
DIGITAL SCREENING BILATERAL MAMMOGRAM WITH TOMO AND CAD

[L CC synth-2D]
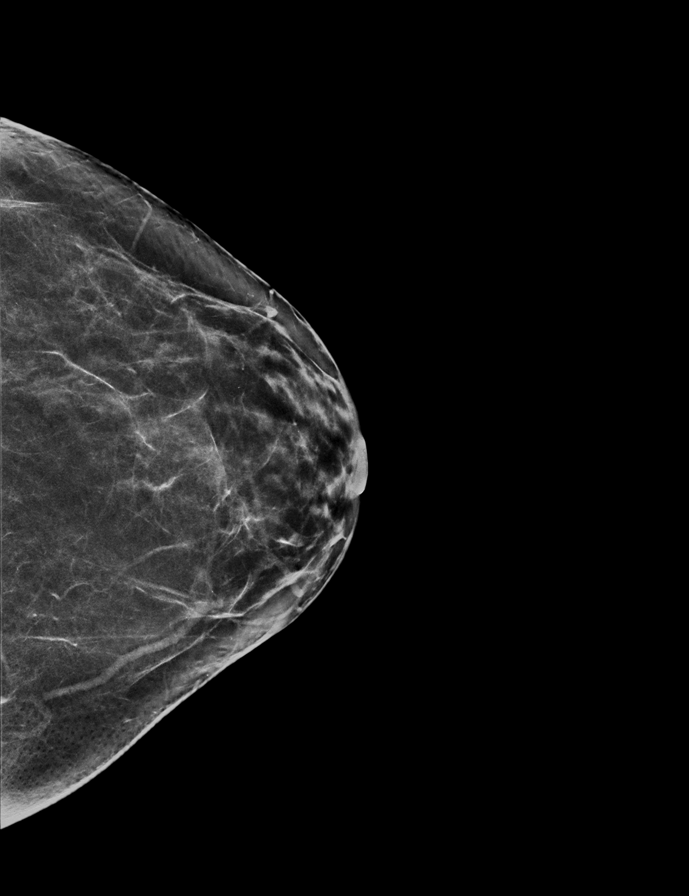

[R CC synth-2D]
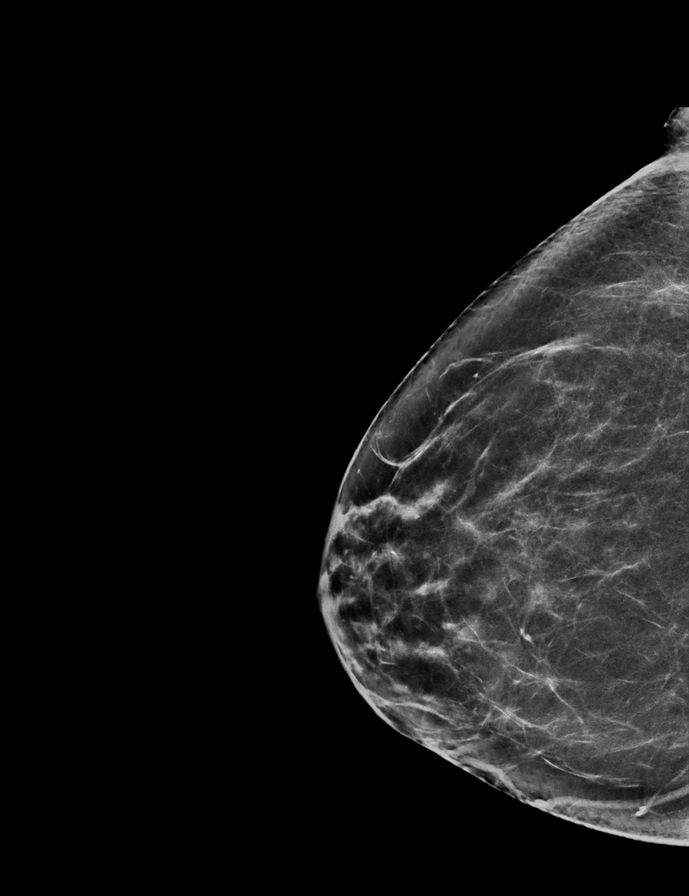

[L MLO synth-2D]
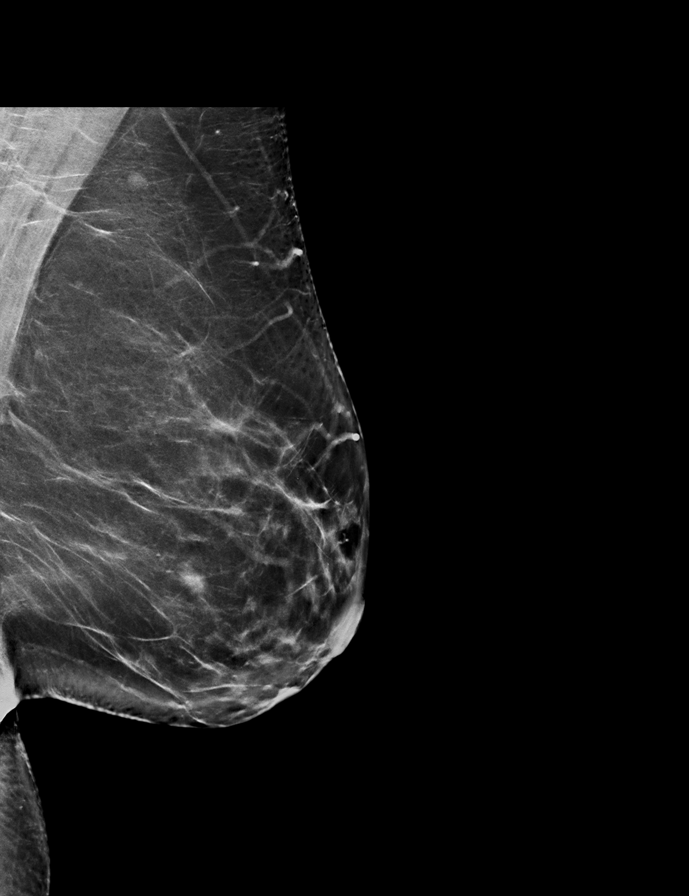

[R MLO synth-2D]
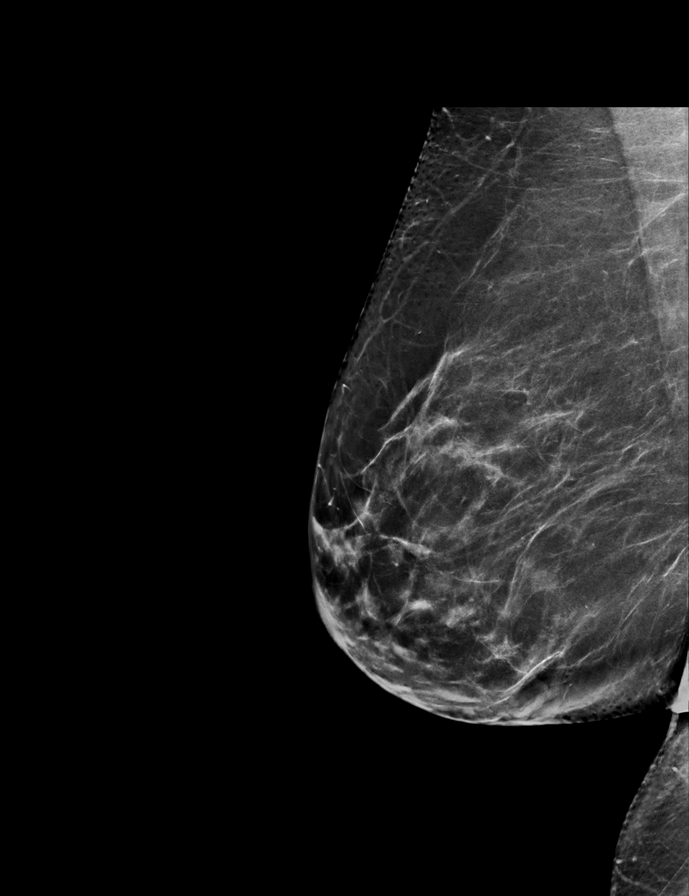

[L MLO tomo · 2 of 78 frames shown]
[frame 26/78]
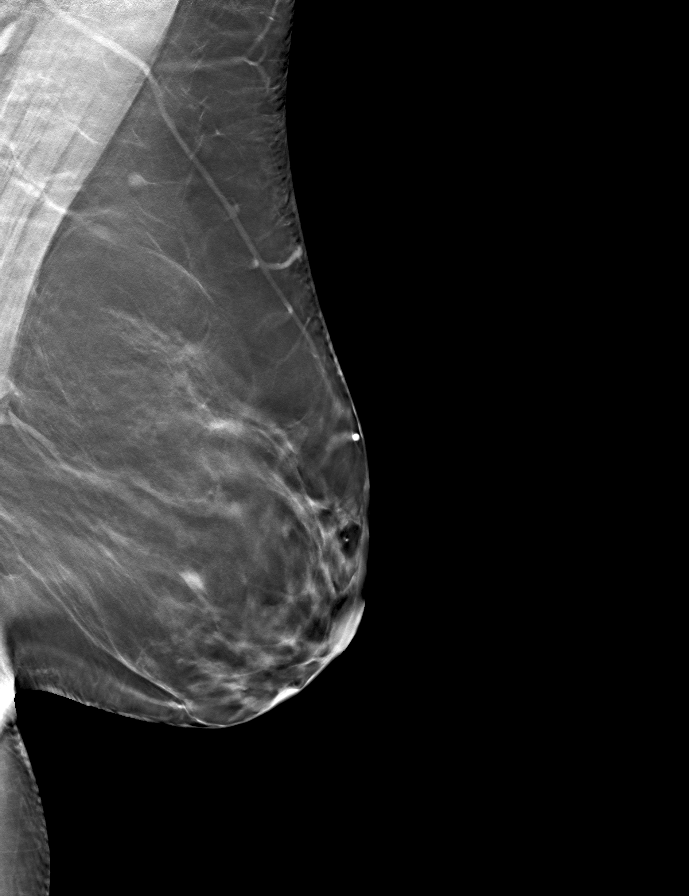
[frame 39/78]
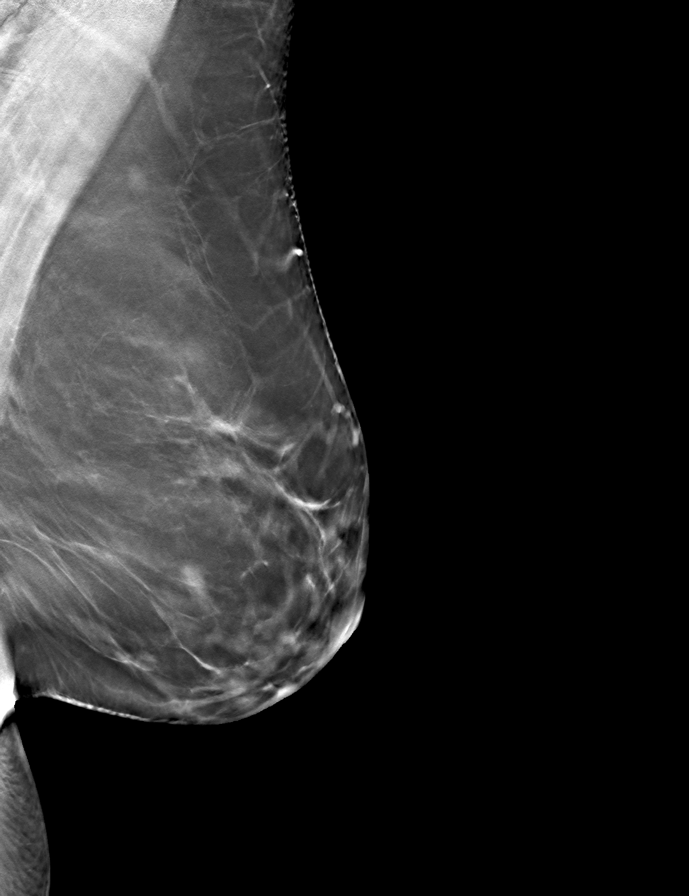

[L CC tomo · tomo slice 33/64.0]
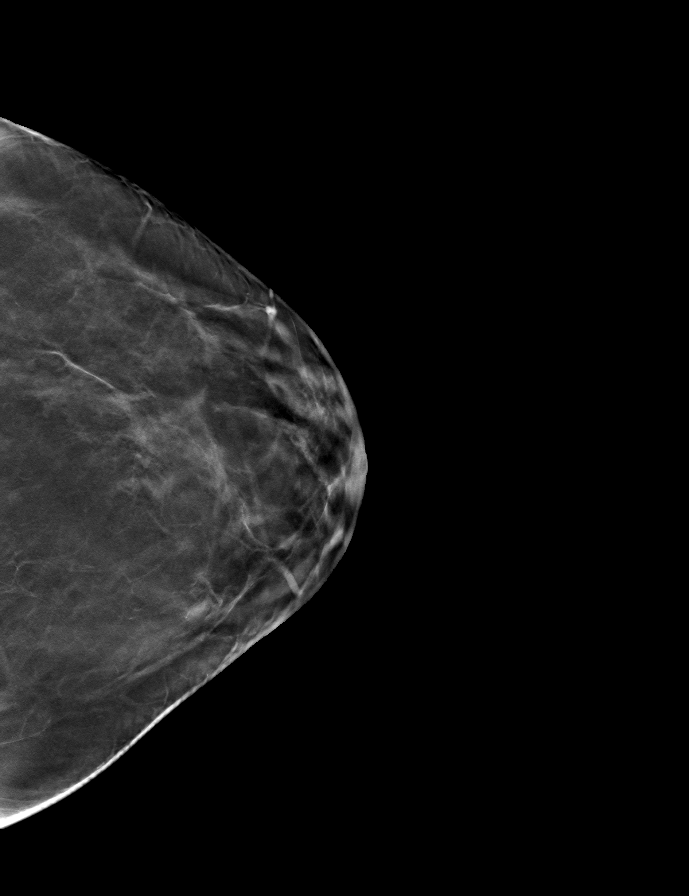

[R MLO tomo · tomo slice 37/73.0]
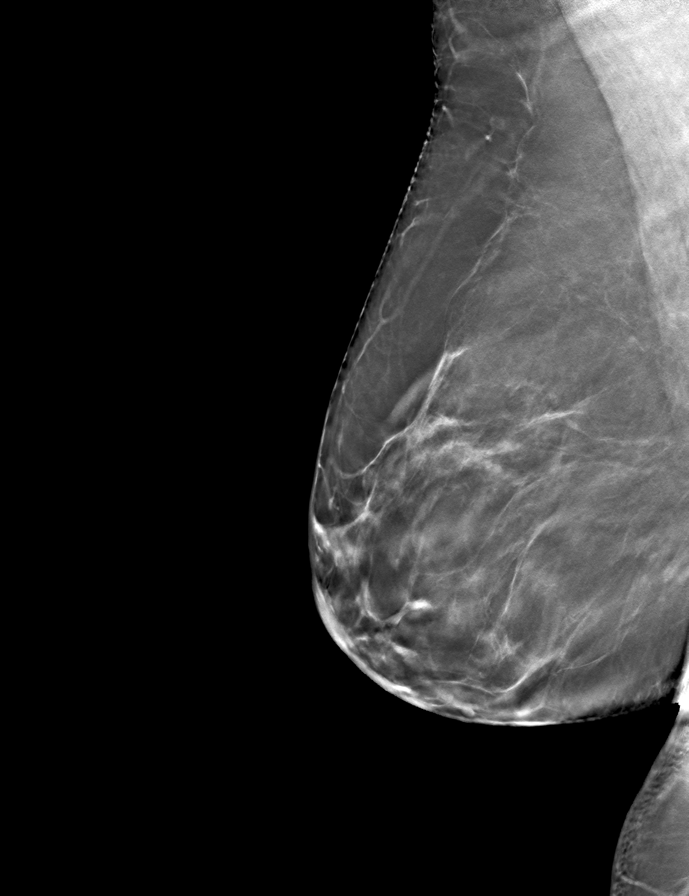

[R CC tomo · tomo slice 33/66.0]
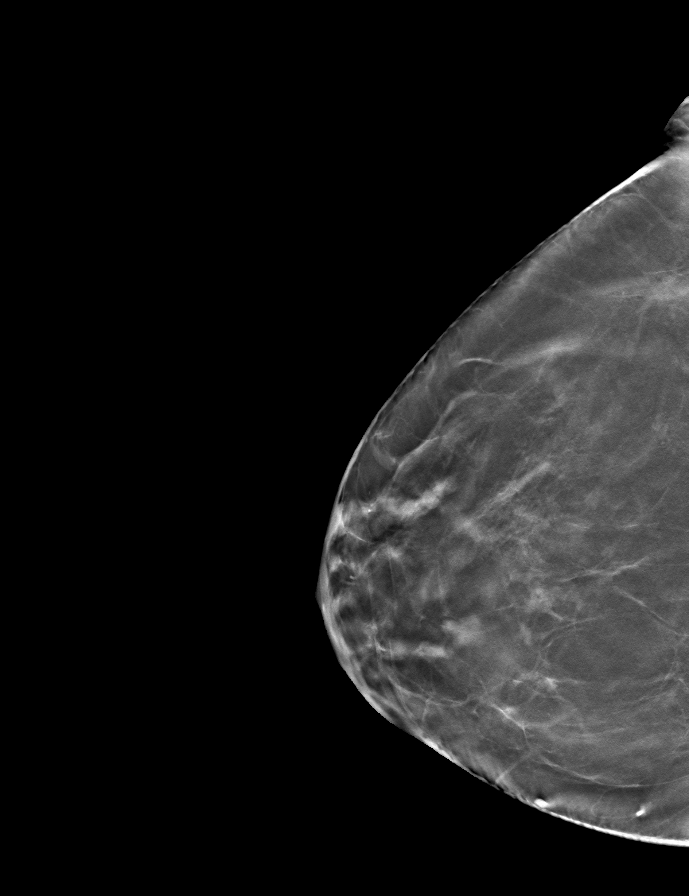

[9 of 24 positions shown; findings below may reference images not displayed]

ACR Breast Density Category b: There are scattered areas of
fibroglandular density.
FINDINGS: There are no findings suspicious for malignancy. Images were
processed with CAD.
IMPRESSION: No mammographic evidence of malignancy. A result letter of this
screening mammogram will be mailed directly to the patient.

RECOMMENDATION:
Screening mammogram in one year. (Code:H9-X-36S)

BI-RADS CATEGORY  1: Negative.

## 2021-05-24 ENCOUNTER — Other Ambulatory Visit: Payer: Self-pay

## 2021-05-24 ENCOUNTER — Ambulatory Visit: Payer: BC Managed Care – PPO | Admitting: Family Medicine

## 2021-05-24 VITALS — BP 120/86 | HR 96 | Temp 97.2°F | Ht 61.0 in | Wt 190.0 lb

## 2021-05-24 DIAGNOSIS — E785 Hyperlipidemia, unspecified: Secondary | ICD-10-CM | POA: Insufficient documentation

## 2021-05-24 DIAGNOSIS — I10 Essential (primary) hypertension: Secondary | ICD-10-CM | POA: Diagnosis not present

## 2021-05-24 DIAGNOSIS — J988 Other specified respiratory disorders: Secondary | ICD-10-CM | POA: Diagnosis not present

## 2021-05-24 DIAGNOSIS — F411 Generalized anxiety disorder: Secondary | ICD-10-CM

## 2021-05-24 MED ORDER — PROMETHAZINE-DM 6.25-15 MG/5ML PO SYRP: 5.0000 mL | ORAL_SOLUTION | Freq: Four times a day (QID) | ORAL | 0 refills | Status: DC | PRN

## 2021-05-24 MED ORDER — SERTRALINE HCL 100 MG PO TABS: 100.0000 mg | ORAL_TABLET | Freq: Every day | ORAL | 1 refills | Status: DC

## 2021-05-24 NOTE — Assessment & Plan Note (Signed)
Increasing Zoloft to 100 mg daily.  Continue Klonopin.

## 2021-05-24 NOTE — Assessment & Plan Note (Signed)
Likely viral in origin.  There is no evidence of bacterial infection at this time.  Awaiting COVID, flu testing.  Work note given.  Promethazine DM for cough.  Supportive care.

## 2021-05-24 NOTE — Assessment & Plan Note (Signed)
At goal.  Continue enalapril. 

## 2021-05-24 NOTE — Progress Notes (Signed)
Subjective:  Patient ID: Martha Crawford, female    DOB: Aug 08, 1965  Age: 55 y.o. MRN: [id number]  CC: Chief Complaint  Patient presents with   Cough   Headache   Facial Pain    HPI:  55 year old female with GERD, GAD, Insomnia, HTN presents to establish care with me. Currently has respiratory symptoms.  Respiratory infection Patient reports that she had a cough the day before yesterday.  Developed worsening symptoms last night. She reports headache, cough, facial pain.  Subjective fever.  No documented fever.  She has taken ibuprofen and NyQuil with some improvement but no resolution. Has a scratchy throat as well.  Primary complaint is cough at this time. 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  Anxiety Patient reports recent increase in anxiety secondary to the holidays and the fact that her mother is staying with her.  She states that the clonazepam works well but does make her drowsy.  She would like to discuss if there is something that she can take during the day.  She is also on Zoloft 75 mg daily.  Hypertension BP is well controlled on enalapril.  Patient Active Problem List   Diagnosis Date Noted   Hyperlipidemia 05/24/2021   Respiratory infection 05/24/2021   Essential hypertension 08/16/2016   Tension headache 02/26/2013   Esophageal reflux 02/26/2013   Insomnia 02/26/2013   Generalized anxiety disorder 02/26/2013    Social Hx   Social History   Socioeconomic History   Marital status: Married    Spouse name: Not on file   Number of children: Not on file   Years of education: Not on file   Highest education level: Not on file  Occupational History   Not on file  Tobacco Use   Smoking status: Never   Smokeless tobacco: Never  Substance and Sexual Activity   Alcohol use: No   Drug use: No   Sexual activity: Yes    Birth control/protection: None  Other Topics Concern   Not on file  Social History Narrative   Not on file   Social  Determinants of Health   Financial Resource Strain: Not on file  Food Insecurity: Not on file  Transportation Needs: Not on file  Physical Activity: Not on file  Stress: Not on file  Social Connections: Not on file    Review of Systems Per HPI  Objective:  BP 120/86   Pulse 96   Temp (!) 97.2 F (36.2 C)   Ht 5\' 1"  (1.549 m)   Wt 190 lb (86.2 kg)   SpO2 98%   BMI 35.90 kg/m   BP/Weight 05/24/2021 12/23/2020 09/01/2020  Systolic BP 120 132 130  Diastolic BP 86 70 84  Wt. (Lbs) 190 192 190  BMI 35.9 36.28 35.9    Physical Exam Vitals and nursing note reviewed.  Constitutional:      General: She is not in acute distress.    Appearance: She is well-developed. She is obese. She is not ill-appearing.  HENT:     Head: Normocephalic and atraumatic.     Ears:     Comments: TMs with scarring but no evidence of infection.    Nose: Nose normal.  Eyes:     General:        Right eye: No discharge.        Left eye: No discharge.     Conjunctiva/sclera: Conjunctivae normal.  Cardiovascular:     Rate and Rhythm: Normal rate  and regular rhythm.     Heart sounds: No murmur heard. Pulmonary:     Effort: Pulmonary effort is normal.     Breath sounds: No wheezing, rhonchi or rales.  Neurological:     Mental Status: She is alert.    Lab Results  Component Value Date   WBC 6.8 08/31/2020   HGB 12.2 08/31/2020   HCT 37.0 08/31/2020   PLT 349 08/31/2020   GLUCOSE 95 08/31/2020   CHOL 194 08/31/2020   TRIG 77 08/31/2020   HDL 52 08/31/2020   LDLCALC 128 (H) 08/31/2020   ALT 21 08/31/2020   AST 17 08/31/2020   NA 141 08/31/2020   K 4.5 08/31/2020   CL 100 08/31/2020   CREATININE 0.58 08/31/2020   BUN 9 08/31/2020   CO2 24 08/31/2020   TSH 4.220 08/31/2020     Assessment & Plan:   Problem List Items Addressed This Visit       Cardiovascular and Mediastinum   Essential hypertension    At goal.  Continue enalapril.        Respiratory   Respiratory infection -  Primary    Likely viral in origin.  There is no evidence of bacterial infection at this time.  Awaiting COVID, flu testing.  Work note given.  Promethazine DM for cough.  Supportive care.      Relevant Orders   COVID-19, Flu A+B and RSV     Other   Generalized anxiety disorder    Increasing Zoloft to 100 mg daily.  Continue Klonopin.      Relevant Medications   sertraline (ZOLOFT) 100 MG tablet    Meds ordered this encounter  Medications   promethazine-dextromethorphan (PROMETHAZINE-DM) 6.25-15 MG/5ML syrup    Sig: Take 5 mLs by mouth 4 (four) times daily as needed for cough.    Dispense:  118 mL    Refill:  0   sertraline (ZOLOFT) 100 MG tablet    Sig: Take 1 tablet (100 mg total) by mouth daily.    Dispense:  90 tablet    Refill:  1    Follow-up:  Return in about 6 months (around 11/21/2021).  Everlene Other DO [facility]

## 2021-05-24 NOTE — Patient Instructions (Addendum)
Rest. Lots of fluids.  Cough medication as directed.  I have increased the Zoloft.  Please be sure to get your mammogram.  I will place another referral for colonoscopy.  Take care  Dr. Adriana Simas

## 2021-05-25 LAB — COVID-19, FLU A+B AND RSV
Influenza A, NAA: NOT DETECTED
Influenza B, NAA: NOT DETECTED
RSV, NAA: NOT DETECTED
SARS-CoV-2, NAA: NOT DETECTED

## 2021-05-25 LAB — SPECIMEN STATUS REPORT

## 2021-06-07 ENCOUNTER — Encounter: Payer: Self-pay | Admitting: Family Medicine

## 2021-06-07 DIAGNOSIS — K219 Gastro-esophageal reflux disease without esophagitis: Secondary | ICD-10-CM

## 2021-06-08 MED ORDER — PANTOPRAZOLE SODIUM 40 MG PO TBEC: DELAYED_RELEASE_TABLET | ORAL | 5 refills | Status: DC

## 2021-06-27 ENCOUNTER — Encounter: Payer: Self-pay | Admitting: Family Medicine

## 2021-06-27 DIAGNOSIS — G4489 Other headache syndrome: Secondary | ICD-10-CM

## 2021-06-28 MED ORDER — BUTALBITAL-APAP-CAFFEINE 50-325-40 MG PO TABS: ORAL_TABLET | ORAL | 3 refills | Status: DC

## 2021-07-18 ENCOUNTER — Other Ambulatory Visit: Payer: Self-pay | Admitting: Family Medicine

## 2021-07-18 MED ORDER — PROMETHAZINE-DM 6.25-15 MG/5ML PO SYRP: 5.0000 mL | ORAL_SOLUTION | Freq: Four times a day (QID) | ORAL | 0 refills | Status: DC | PRN

## 2021-07-20 ENCOUNTER — Encounter: Payer: Self-pay | Admitting: Family Medicine

## 2021-08-24 ENCOUNTER — Other Ambulatory Visit: Payer: Self-pay | Admitting: Family Medicine

## 2021-08-24 ENCOUNTER — Encounter: Payer: Self-pay | Admitting: Family Medicine

## 2021-08-24 DIAGNOSIS — G4489 Other headache syndrome: Secondary | ICD-10-CM

## 2021-08-24 DIAGNOSIS — K219 Gastro-esophageal reflux disease without esophagitis: Secondary | ICD-10-CM

## 2021-08-24 DIAGNOSIS — I10 Essential (primary) hypertension: Secondary | ICD-10-CM

## 2021-08-24 MED ORDER — ENALAPRIL MALEATE 5 MG PO TABS: 5.0000 mg | ORAL_TABLET | Freq: Every day | ORAL | 3 refills | Status: DC

## 2021-08-24 MED ORDER — PANTOPRAZOLE SODIUM 40 MG PO TBEC: DELAYED_RELEASE_TABLET | ORAL | 3 refills | Status: DC

## 2021-08-24 MED ORDER — BUTALBITAL-APAP-CAFFEINE 50-325-40 MG PO TABS: 1.0000 | ORAL_TABLET | Freq: Four times a day (QID) | ORAL | 3 refills | Status: DC | PRN

## 2021-09-02 ENCOUNTER — Other Ambulatory Visit: Payer: Self-pay | Admitting: Family Medicine

## 2021-11-22 ENCOUNTER — Ambulatory Visit: Payer: BC Managed Care – PPO | Admitting: Family Medicine

## 2021-11-23 ENCOUNTER — Encounter: Payer: Self-pay | Admitting: Family Medicine

## 2021-12-22 ENCOUNTER — Other Ambulatory Visit: Payer: Self-pay | Admitting: Family Medicine

## 2021-12-22 DIAGNOSIS — F411 Generalized anxiety disorder: Secondary | ICD-10-CM

## 2022-03-24 ENCOUNTER — Other Ambulatory Visit: Payer: Self-pay | Admitting: Family Medicine

## 2022-03-24 DIAGNOSIS — F411 Generalized anxiety disorder: Secondary | ICD-10-CM

## 2022-03-28 ENCOUNTER — Other Ambulatory Visit: Payer: Self-pay | Admitting: Family Medicine

## 2022-03-28 ENCOUNTER — Encounter: Payer: Self-pay | Admitting: Family Medicine

## 2022-03-28 DIAGNOSIS — F411 Generalized anxiety disorder: Secondary | ICD-10-CM

## 2022-03-28 DIAGNOSIS — I10 Essential (primary) hypertension: Secondary | ICD-10-CM

## 2022-03-28 MED ORDER — ENALAPRIL MALEATE 5 MG PO TABS: 5.0000 mg | ORAL_TABLET | Freq: Every day | ORAL | 3 refills | Status: DC

## 2022-03-28 MED ORDER — SERTRALINE HCL 100 MG PO TABS: 100.0000 mg | ORAL_TABLET | Freq: Every day | ORAL | 3 refills | Status: DC

## 2022-03-31 ENCOUNTER — Ambulatory Visit: Payer: BC Managed Care – PPO | Admitting: Family Medicine

## 2022-03-31 ENCOUNTER — Encounter: Payer: Self-pay | Admitting: Family Medicine

## 2022-03-31 VITALS — BP 124/74 | HR 74 | Temp 97.7°F | Wt 193.2 lb

## 2022-03-31 DIAGNOSIS — I10 Essential (primary) hypertension: Secondary | ICD-10-CM | POA: Diagnosis not present

## 2022-03-31 DIAGNOSIS — Z1211 Encounter for screening for malignant neoplasm of colon: Secondary | ICD-10-CM

## 2022-03-31 DIAGNOSIS — Z130 Encounter for screening for diseases of the blood and blood-forming organs and certain disorders involving the immune mechanism: Secondary | ICD-10-CM | POA: Diagnosis not present

## 2022-03-31 DIAGNOSIS — E7800 Pure hypercholesterolemia, unspecified: Secondary | ICD-10-CM | POA: Diagnosis not present

## 2022-03-31 DIAGNOSIS — K219 Gastro-esophageal reflux disease without esophagitis: Secondary | ICD-10-CM | POA: Diagnosis not present

## 2022-03-31 DIAGNOSIS — F411 Generalized anxiety disorder: Secondary | ICD-10-CM

## 2022-03-31 DIAGNOSIS — Z0000 Encounter for general adult medical examination without abnormal findings: Secondary | ICD-10-CM | POA: Insufficient documentation

## 2022-03-31 NOTE — Assessment & Plan Note (Signed)
Declines flu vaccine today.  Information given regarding how to schedule mammogram.  Labs today.  Cologuard ordered.

## 2022-03-31 NOTE — Patient Instructions (Signed)
Labs at your convenience.  Cologuard will come to your house.  Call [phone] to schedule your mammogram.  Follow up in 6 months.

## 2022-03-31 NOTE — Assessment & Plan Note (Signed)
Well-controlled.  Continue enalapril.  Metabolic panel today.

## 2022-03-31 NOTE — Assessment & Plan Note (Signed)
Stable ?Continue Protonix ?

## 2022-03-31 NOTE — Progress Notes (Signed)
Subjective:  Patient ID: Martha Crawford, female    DOB: 09-Aug-1965  Age: 56 y.o. MRN: [id number]  CC: Chief Complaint  Patient presents with   Follow-up    Pt states she may be in peri menopause-going on for about 2 years (hot flashes, cycles stopped then some spotting happening). Pt doing well on medications.     HPI:  57 year old female with hypertension, GERD, anxiety, hyperlipidemia, headaches presents for follow-up.  Patient states that overall she is doing well.  She is experiencing symptoms menopausal symptoms.  Patient's hypertension is well controlled on enalapril.  Compliant with therapy.  GERD is stable on Protonix.  Patient states that her anxiety is stable on Zoloft.  Uses Klonopin as needed.  Needs laboratory studies.  Needs colonoscopy.  Has been reluctant to do this.  We will proceed with Cologuard.  She is overdue for mammogram.  She has not had one since 2020.  We will provide number to the hospital so that she can call and schedule.  Declines flu vaccine today.  Patient Active Problem List   Diagnosis Date Noted   GERD (gastroesophageal reflux disease) 03/31/2022   Preventative health care 03/31/2022   Hyperlipidemia 05/24/2021   Essential hypertension 08/16/2016   Tension headache 02/26/2013   Insomnia 02/26/2013   Generalized anxiety disorder 02/26/2013    Social Hx   Social History   Socioeconomic History   Marital status: Married    Spouse name: Not on file   Number of children: Not on file   Years of education: Not on file   Highest education level: Not on file  Occupational History   Not on file  Tobacco Use   Smoking status: Never   Smokeless tobacco: Never  Substance and Sexual Activity   Alcohol use: No   Drug use: No   Sexual activity: Yes    Birth control/protection: None  Other Topics Concern   Not on file  Social History Narrative   Not on file   Social Determinants of Health   Financial Resource Strain: Not on file  Food  Insecurity: Not on file  Transportation Needs: Not on file  Physical Activity: Not on file  Stress: Not on file  Social Connections: Not on file    Review of Systems  Respiratory: Negative.    Cardiovascular: Negative.   Gastrointestinal: Negative.    Objective:  BP 124/74   Pulse 74   Temp 97.7 F (36.5 C)   Wt 193 lb 3.2 oz (87.6 kg)   SpO2 99%   BMI 36.50 kg/m      03/31/2022    9:40 AM 05/24/2021    8:31 AM 12/23/2020    2:18 PM  BP/Weight  Systolic BP 956 213 086  Diastolic BP 74 86 70  Wt. (Lbs) 193.2 190 192  BMI 36.5 kg/m2 35.9 kg/m2 36.28 kg/m2    Physical Exam Vitals and nursing note reviewed.  Constitutional:      General: She is not in acute distress.    Appearance: Normal appearance. She is obese.  HENT:     Head: Normocephalic and atraumatic.  Eyes:     General:        Right eye: No discharge.        Left eye: No discharge.     Conjunctiva/sclera: Conjunctivae normal.  Cardiovascular:     Rate and Rhythm: Normal rate and regular rhythm.  Pulmonary:     Effort: Pulmonary effort is normal.     Breath  sounds: Normal breath sounds. No wheezing, rhonchi or rales.  Abdominal:     General: There is no distension.     Palpations: Abdomen is soft.     Tenderness: There is no abdominal tenderness.  Neurological:     Mental Status: She is alert.  Psychiatric:        Mood and Affect: Mood normal.        Behavior: Behavior normal.     Lab Results  Component Value Date   WBC 6.8 08/31/2020   HGB 12.2 08/31/2020   HCT 37.0 08/31/2020   PLT 349 08/31/2020   GLUCOSE 95 08/31/2020   CHOL 194 08/31/2020   TRIG 77 08/31/2020   HDL 52 08/31/2020   LDLCALC 128 (H) 08/31/2020   ALT 21 08/31/2020   AST 17 08/31/2020   NA 141 08/31/2020   K 4.5 08/31/2020   CL 100 08/31/2020   CREATININE 0.58 08/31/2020   BUN 9 08/31/2020   CO2 24 08/31/2020   TSH 4.220 08/31/2020     Assessment & Plan:   Problem List Items Addressed This Visit        Cardiovascular and Mediastinum   Essential hypertension - Primary    Well-controlled.  Continue enalapril.  Metabolic panel today.      Relevant Orders   CMP14+EGFR     Digestive   GERD (gastroesophageal reflux disease)    Stable.  Continue Protonix.        Other   Generalized anxiety disorder    Stable on Zoloft and as needed Klonopin.  Continue.      Hyperlipidemia   Relevant Orders   Lipid panel   Preventative health care    Declines flu vaccine today.  Information given regarding how to schedule mammogram.  Labs today.  Cologuard ordered.      Other Visit Diagnoses     Screening for deficiency anemia       Relevant Orders   CBC   Colon cancer screening       Relevant Orders   Cologuard      Follow-up:  Return in about 6 months (around 09/30/2022).  [place]

## 2022-03-31 NOTE — Assessment & Plan Note (Signed)
Stable on Zoloft and as needed Klonopin.  Continue.

## 2022-04-23 LAB — COLOGUARD: COLOGUARD: NEGATIVE

## 2022-04-26 ENCOUNTER — Encounter: Payer: Self-pay | Admitting: Family Medicine

## 2022-10-02 ENCOUNTER — Ambulatory Visit (INDEPENDENT_AMBULATORY_CARE_PROVIDER_SITE_OTHER): Payer: BC Managed Care – PPO | Admitting: Family Medicine

## 2022-10-02 VITALS — BP 128/80 | HR 77 | Temp 97.7°F | Ht 61.0 in | Wt 189.0 lb

## 2022-10-02 DIAGNOSIS — I10 Essential (primary) hypertension: Secondary | ICD-10-CM

## 2022-10-02 DIAGNOSIS — G44209 Tension-type headache, unspecified, not intractable: Secondary | ICD-10-CM

## 2022-10-02 DIAGNOSIS — Z130 Encounter for screening for diseases of the blood and blood-forming organs and certain disorders involving the immune mechanism: Secondary | ICD-10-CM

## 2022-10-02 DIAGNOSIS — E559 Vitamin D deficiency, unspecified: Secondary | ICD-10-CM

## 2022-10-02 DIAGNOSIS — E7800 Pure hypercholesterolemia, unspecified: Secondary | ICD-10-CM

## 2022-10-02 DIAGNOSIS — F411 Generalized anxiety disorder: Secondary | ICD-10-CM | POA: Diagnosis not present

## 2022-10-02 DIAGNOSIS — G4700 Insomnia, unspecified: Secondary | ICD-10-CM | POA: Diagnosis not present

## 2022-10-02 DIAGNOSIS — K219 Gastro-esophageal reflux disease without esophagitis: Secondary | ICD-10-CM

## 2022-10-02 MED ORDER — CLONAZEPAM 0.5 MG PO TABS: 0.2500 mg | ORAL_TABLET | Freq: Two times a day (BID) | ORAL | 3 refills | Status: DC | PRN

## 2022-10-02 MED ORDER — BUTALBITAL-APAP-CAFFEINE 50-325-40 MG PO TABS: 1.0000 | ORAL_TABLET | Freq: Four times a day (QID) | ORAL | 3 refills | Status: DC | PRN

## 2022-10-02 NOTE — Assessment & Plan Note (Signed)
Stable.  Continue enalapril. 

## 2022-10-02 NOTE — Patient Instructions (Addendum)
Meds refilled.  Labs today.  Call [phone] to schedule mammogram.  Follow up in 6 months.  Stable.  Continue

## 2022-10-02 NOTE — Assessment & Plan Note (Signed)
Clonazepam refilled 

## 2022-10-02 NOTE — Assessment & Plan Note (Signed)
Continue as needed use of Fioricet.

## 2022-10-02 NOTE — Assessment & Plan Note (Signed)
Stable on Protonix.  Continue. 

## 2022-10-02 NOTE — Progress Notes (Signed)
Subjective:  Patient ID: Martha Crawford, female    DOB: 07-Jun-1966  Age: 57 y.o. MRN: [id number]  CC: Chief Complaint  Patient presents with   Hypertension    HPI:  57 year old female with hypertension, GERD, insomnia, hyperlipidemia, tension headache, anxiety presents for follow-up  Patient states that she recently had a stomach bug.  She is still having some upper abdominal discomfort but overall her symptoms have improved.  Hypertension is stable on enalapril.  Patient uses sertraline and clonazepam for anxiety.  Needs refill on clonazepam.  Patient needs labs today.  Patient also needs a mammogram.  GERD is stable on Protonix.  Patient uses Fioricet for headaches.  Needs refill.  Patient Active Problem List   Diagnosis Date Noted   GERD (gastroesophageal reflux disease) 03/31/2022   Preventative health care 03/31/2022   Hyperlipidemia 05/24/2021   Essential hypertension 08/16/2016   Tension headache 02/26/2013   Insomnia 02/26/2013   Generalized anxiety disorder 02/26/2013    Social Hx   Social History   Socioeconomic History   Marital status: Married    Spouse name: Not on file   Number of children: Not on file   Years of education: Not on file   Highest education level: Not on file  Occupational History   Not on file  Tobacco Use   Smoking status: Never   Smokeless tobacco: Never  Substance and Sexual Activity   Alcohol use: No   Drug use: No   Sexual activity: Yes    Birth control/protection: None  Other Topics Concern   Not on file  Social History Narrative   Not on file   Social Determinants of Health   Financial Resource Strain: Not on file  Food Insecurity: Not on file  Transportation Needs: Not on file  Physical Activity: Not on file  Stress: Not on file  Social Connections: Not on file    Review of Systems  Constitutional: Negative.   Gastrointestinal:  Positive for abdominal pain.       Nausea, vomiting, and diarrhea have  resolved.   Objective:  BP 128/80   Pulse 77   Temp 97.7 F (36.5 C)   Ht 5\' 1"  (1.549 m)   Wt 189 lb (85.7 kg)   SpO2 98%   BMI 35.71 kg/m      10/02/2022    9:53 AM 03/31/2022    9:40 AM 05/24/2021    8:31 AM  BP/Weight  Systolic BP 128 124 120  Diastolic BP 80 74 86  Wt. (Lbs) 189 193.2 190  BMI 35.71 kg/m2 36.5 kg/m2 35.9 kg/m2    Physical Exam Vitals and nursing note reviewed.  Constitutional:      General: She is not in acute distress.    Appearance: Normal appearance.  HENT:     Head: Normocephalic and atraumatic.  Eyes:     General:        Right eye: No discharge.        Left eye: No discharge.     Conjunctiva/sclera: Conjunctivae normal.  Cardiovascular:     Rate and Rhythm: Normal rate and regular rhythm.  Pulmonary:     Effort: Pulmonary effort is normal.     Breath sounds: Normal breath sounds. No wheezing or rales.  Abdominal:     General: There is no distension.     Palpations: Abdomen is soft.     Tenderness: There is no abdominal tenderness.  Neurological:     Mental Status: She is alert.  Psychiatric:        Mood and Affect: Mood normal.        Behavior: Behavior normal.     Lab Results  Component Value Date   WBC 6.8 08/31/2020   HGB 12.2 08/31/2020   HCT 37.0 08/31/2020   PLT 349 08/31/2020   GLUCOSE 95 08/31/2020   CHOL 194 08/31/2020   TRIG 77 08/31/2020   HDL 52 08/31/2020   LDLCALC 128 (H) 08/31/2020   ALT 21 08/31/2020   AST 17 08/31/2020   NA 141 08/31/2020   K 4.5 08/31/2020   CL 100 08/31/2020   CREATININE 0.58 08/31/2020   BUN 9 08/31/2020   CO2 24 08/31/2020   TSH 4.220 08/31/2020     Assessment & Plan:   Problem List Items Addressed This Visit       Cardiovascular and Mediastinum   Essential hypertension - Primary    Stable.  Continue enalapril.      Relevant Orders   CMP14+EGFR     Digestive   GERD (gastroesophageal reflux disease)    Stable on Protonix.  Continue.        Other   Insomnia    Relevant Medications   clonazePAM (KLONOPIN) 0.5 MG tablet   Hyperlipidemia   Relevant Orders   Lipid panel   Tension headache    Continue as needed use of Fioricet.      Relevant Medications   butalbital-acetaminophen-caffeine (FIORICET) 50-325-40 MG tablet   clonazePAM (KLONOPIN) 0.5 MG tablet   Generalized anxiety disorder    Clonazepam refilled.      Relevant Medications   clonazePAM (KLONOPIN) 0.5 MG tablet   Other Visit Diagnoses     Screening for deficiency anemia       Relevant Orders   CBC   Vitamin D deficiency       Relevant Orders   Vitamin D, 25-hydroxy       Meds ordered this encounter  Medications   butalbital-acetaminophen-caffeine (FIORICET) 50-325-40 MG tablet    Sig: Take 1 tablet by mouth every 6 (six) hours as needed for headache.    Dispense:  30 tablet    Refill:  3   clonazePAM (KLONOPIN) 0.5 MG tablet    Sig: Take 0.5-1 tablets (0.25-0.5 mg total) by mouth 2 (two) times daily as needed for anxiety.    Dispense:  30 tablet    Refill:  3    Follow-up:  Return in about 6 months (around 04/03/2023).  Everlene Other DO [facility]

## 2023-01-02 ENCOUNTER — Other Ambulatory Visit: Payer: Self-pay | Admitting: Family Medicine

## 2023-01-02 DIAGNOSIS — K219 Gastro-esophageal reflux disease without esophagitis: Secondary | ICD-10-CM

## 2023-02-23 ENCOUNTER — Ambulatory Visit: Payer: BC Managed Care – PPO | Admitting: Nurse Practitioner

## 2023-04-03 ENCOUNTER — Other Ambulatory Visit: Payer: Self-pay | Admitting: Family Medicine

## 2023-04-03 DIAGNOSIS — G4700 Insomnia, unspecified: Secondary | ICD-10-CM

## 2023-04-03 DIAGNOSIS — F411 Generalized anxiety disorder: Secondary | ICD-10-CM

## 2023-04-04 ENCOUNTER — Ambulatory Visit: Payer: BC Managed Care – PPO | Admitting: Family Medicine

## 2023-04-04 VITALS — BP 128/80 | HR 87 | Wt 192.8 lb

## 2023-04-04 DIAGNOSIS — Z130 Encounter for screening for diseases of the blood and blood-forming organs and certain disorders involving the immune mechanism: Secondary | ICD-10-CM

## 2023-04-04 DIAGNOSIS — K219 Gastro-esophageal reflux disease without esophagitis: Secondary | ICD-10-CM | POA: Diagnosis not present

## 2023-04-04 DIAGNOSIS — E7800 Pure hypercholesterolemia, unspecified: Secondary | ICD-10-CM

## 2023-04-04 DIAGNOSIS — Z0000 Encounter for general adult medical examination without abnormal findings: Secondary | ICD-10-CM

## 2023-04-04 DIAGNOSIS — I10 Essential (primary) hypertension: Secondary | ICD-10-CM | POA: Diagnosis not present

## 2023-04-04 DIAGNOSIS — F411 Generalized anxiety disorder: Secondary | ICD-10-CM

## 2023-04-04 MED ORDER — PANTOPRAZOLE SODIUM 40 MG PO TBEC
DELAYED_RELEASE_TABLET | ORAL | 3 refills | Status: DC
Start: 2023-04-04 — End: 2024-04-07

## 2023-04-04 MED ORDER — SERTRALINE HCL 100 MG PO TABS: 100.0000 mg | ORAL_TABLET | Freq: Every day | ORAL | 3 refills | Status: DC

## 2023-04-04 MED ORDER — ENALAPRIL MALEATE 5 MG PO TABS: 5.0000 mg | ORAL_TABLET | Freq: Every day | ORAL | 3 refills | Status: DC

## 2023-04-04 NOTE — Patient Instructions (Signed)
Labs today.  Call [phone] to schedule mammogram.  Follow up in 6 months.

## 2023-04-08 NOTE — Progress Notes (Signed)
Subjective:  Patient ID: Martha Crawford, female    DOB: 1965/10/03  Age: 57 y.o. MRN: [id number]  CC: Follow up  HPI:  57 year old female presents for follow up. She states she is doing well. Has had some recent right ear discomfort. Will examine today.  BP stable on Enalapril.  GERD stable on Protonix.  Anxiety stable on Zoloft. GAD 7 score of 1 today.  She declines flu shot today. Long overdue for mammogram. Advised that she needs to call and get this set up.  Patient Active Problem List   Diagnosis Date Noted   GERD (gastroesophageal reflux disease) 03/31/2022   Preventative health care 03/31/2022   Hyperlipidemia 05/24/2021   Essential hypertension 08/16/2016   Tension headache 02/26/2013   Insomnia 02/26/2013   Generalized anxiety disorder 02/26/2013    Social Hx   Social History   Socioeconomic History   Marital status: Married    Spouse name: Not on file   Number of children: Not on file   Years of education: Not on file   Highest education level: Not on file  Occupational History   Not on file  Tobacco Use   Smoking status: Never   Smokeless tobacco: Never  Substance and Sexual Activity   Alcohol use: No   Drug use: No   Sexual activity: Yes    Birth control/protection: None  Other Topics Concern   Not on file  Social History Narrative   Not on file   Social Determinants of Health   Financial Resource Strain: Not on file  Food Insecurity: Not on file  Transportation Needs: Not on file  Physical Activity: Not on file  Stress: Not on file  Social Connections: Not on file    Review of Systems Per HPI  Objective:  BP 128/80   Pulse 87   Wt 192 lb 12.8 oz (87.5 kg)   SpO2 99%   BMI 36.43 kg/m      04/04/2023   10:11 AM 04/04/2023    9:52 AM 10/02/2022    9:53 AM  BP/Weight  Systolic BP 128 142 128  Diastolic BP 80 80 80  Wt. (Lbs)  192.8 189  BMI  36.43 kg/m2 35.71 kg/m2    Physical Exam Vitals and nursing note reviewed.   Constitutional:      General: She is not in acute distress.    Appearance: Normal appearance.  HENT:     Head: Normocephalic and atraumatic.     Right Ear: Tympanic membrane normal.     Left Ear: Tympanic membrane normal.  Eyes:     General:        Right eye: No discharge.        Left eye: No discharge.     Conjunctiva/sclera: Conjunctivae normal.  Cardiovascular:     Rate and Rhythm: Normal rate and regular rhythm.  Pulmonary:     Effort: Pulmonary effort is normal.     Breath sounds: Normal breath sounds. No wheezing, rhonchi or rales.  Neurological:     Mental Status: She is alert.  Psychiatric:        Mood and Affect: Mood normal.        Behavior: Behavior normal.     Lab Results  Component Value Date   WBC 6.8 08/31/2020   HGB 12.2 08/31/2020   HCT 37.0 08/31/2020   PLT 349 08/31/2020   GLUCOSE 95 08/31/2020   CHOL 194 08/31/2020   TRIG 77 08/31/2020   HDL 52  08/31/2020   LDLCALC 128 (H) 08/31/2020   ALT 21 08/31/2020   AST 17 08/31/2020   NA 141 08/31/2020   K 4.5 08/31/2020   CL 100 08/31/2020   CREATININE 0.58 08/31/2020   BUN 9 08/31/2020   CO2 24 08/31/2020   TSH 4.220 08/31/2020     Assessment & Plan:   Problem List Items Addressed This Visit       Cardiovascular and Mediastinum   Essential hypertension - Primary    Stable on Enalapril.      Relevant Medications   enalapril (VASOTEC) 5 MG tablet   Other Relevant Orders   CMP14+EGFR     Digestive   GERD (gastroesophageal reflux disease)    Stable on Protonix.      Relevant Medications   pantoprazole (PROTONIX) 40 MG tablet     Other   Preventative health care    Advised to get mammogram.      Hyperlipidemia    Needs labs.      Relevant Medications   enalapril (VASOTEC) 5 MG tablet   Other Relevant Orders   Lipid panel   Generalized anxiety disorder    Stable.      Relevant Medications   sertraline (ZOLOFT) 100 MG tablet   Other Visit Diagnoses     Screening for  deficiency anemia       Relevant Orders   CBC       Meds ordered this encounter  Medications   enalapril (VASOTEC) 5 MG tablet    Sig: Take 1 tablet (5 mg total) by mouth daily.    Dispense:  90 tablet    Refill:  3   pantoprazole (PROTONIX) 40 MG tablet    Sig: TAKE (1) TABLET BY MOUTH ONCE DAILY.    Dispense:  90 tablet    Refill:  3   sertraline (ZOLOFT) 100 MG tablet    Sig: Take 1 tablet (100 mg total) by mouth daily.    Dispense:  90 tablet    Refill:  3    Follow-up:  Return in about 6 months (around 10/03/2023) for Follow up Chronic medical issues.  Everlene Other DO [facility]

## 2023-04-08 NOTE — Assessment & Plan Note (Signed)
Needs labs

## 2023-04-08 NOTE — Assessment & Plan Note (Signed)
Stable on Protonix.

## 2023-04-08 NOTE — Assessment & Plan Note (Signed)
Stable

## 2023-04-08 NOTE — Assessment & Plan Note (Signed)
Advised to get mammogram.

## 2023-04-08 NOTE — Assessment & Plan Note (Signed)
Stable on Enalapril.

## 2023-09-28 ENCOUNTER — Other Ambulatory Visit (HOSPITAL_COMMUNITY): Payer: Self-pay | Admitting: Family Medicine

## 2023-09-28 DIAGNOSIS — Z1231 Encounter for screening mammogram for malignant neoplasm of breast: Secondary | ICD-10-CM

## 2023-10-01 ENCOUNTER — Ambulatory Visit (HOSPITAL_COMMUNITY): Payer: Self-pay

## 2023-10-03 ENCOUNTER — Ambulatory Visit: Payer: Self-pay | Admitting: Family Medicine

## 2023-10-03 ENCOUNTER — Encounter: Payer: Self-pay | Admitting: Family Medicine

## 2023-10-03 ENCOUNTER — Other Ambulatory Visit: Payer: Self-pay | Admitting: Family Medicine

## 2023-10-03 ENCOUNTER — Telehealth: Payer: Self-pay | Admitting: Family Medicine

## 2023-10-03 VITALS — BP 127/85 | HR 86 | Temp 97.5°F | Ht 61.0 in | Wt 195.0 lb

## 2023-10-03 DIAGNOSIS — J029 Acute pharyngitis, unspecified: Secondary | ICD-10-CM | POA: Diagnosis not present

## 2023-10-03 DIAGNOSIS — F411 Generalized anxiety disorder: Secondary | ICD-10-CM

## 2023-10-03 DIAGNOSIS — G44209 Tension-type headache, unspecified, not intractable: Secondary | ICD-10-CM

## 2023-10-03 DIAGNOSIS — E7800 Pure hypercholesterolemia, unspecified: Secondary | ICD-10-CM | POA: Diagnosis not present

## 2023-10-03 DIAGNOSIS — I10 Essential (primary) hypertension: Secondary | ICD-10-CM

## 2023-10-03 DIAGNOSIS — G4700 Insomnia, unspecified: Secondary | ICD-10-CM

## 2023-10-03 DIAGNOSIS — Z130 Encounter for screening for diseases of the blood and blood-forming organs and certain disorders involving the immune mechanism: Secondary | ICD-10-CM

## 2023-10-03 LAB — POCT RAPID STREP A (OFFICE): Rapid Strep A Screen: POSITIVE — AB

## 2023-10-03 MED ORDER — AMOXICILLIN 500 MG PO CAPS: 500.0000 mg | ORAL_CAPSULE | Freq: Two times a day (BID) | ORAL | 0 refills | Status: AC

## 2023-10-03 MED ORDER — CETIRIZINE HCL 10 MG PO TABS: 10.0000 mg | ORAL_TABLET | Freq: Every day | ORAL | 1 refills | Status: AC

## 2023-10-03 MED ORDER — BUTALBITAL-APAP-CAFFEINE 50-325-40 MG PO TABS: 1.0000 | ORAL_TABLET | Freq: Four times a day (QID) | ORAL | 3 refills | Status: DC | PRN

## 2023-10-03 MED ORDER — CLONAZEPAM 0.5 MG PO TABS: 0.5000 mg | ORAL_TABLET | Freq: Two times a day (BID) | ORAL | 3 refills | Status: AC | PRN

## 2023-10-03 MED ORDER — NYSTATIN 100000 UNIT/ML MT SUSP: 5.0000 mL | Freq: Four times a day (QID) | OROMUCOSAL | 0 refills | Status: AC

## 2023-10-03 NOTE — Addendum Note (Signed)
 Addended by: Tommie Sams on: 10/03/2023 09:49 PM   Modules accepted: Orders

## 2023-10-03 NOTE — Assessment & Plan Note (Signed)
 Stable.  Continue current medications.

## 2023-10-03 NOTE — Telephone Encounter (Signed)
 Refill on butalbital-acetaminophen-caffeine (FIORICET) 50-325-40 MG table clonazePAM (KLONOPIN) 0.5 MG tablet  [facility]

## 2023-10-03 NOTE — Patient Instructions (Addendum)
 Labs today.  Medication as directed.  Follow up in 6 months.

## 2023-10-03 NOTE — Progress Notes (Signed)
 Subjective:  Patient ID: Martha Crawford, female    DOB: 1966-05-09  Age: 58 y.o. MRN: [id number]  CC:   Chief Complaint  Patient presents with   Follow-up    6 month f/u Essential hypertension   Sore Throat    Sore throat, sinus congestion and face is sore x's 1 day.  When eating mouth is burning x's 1 week     HPI:  58 year old female presents for follow-up.  Hypertension well-controlled.  She is compliant with enalapril.  Anxiety stable on Zoloft and clonazepam.  Patient reports that over the past few days she has had sinus congestion, ear pain.  She states that she has just recently developed sore throat.  She also reports a burning sensation in her mouth.  She states that she has had decreased intake as a result of this.  No fever.  No other complaints at this time.  Patient Active Problem List   Diagnosis Date Noted   Sore throat 10/03/2023   GERD (gastroesophageal reflux disease) 03/31/2022   Preventative health care 03/31/2022   Hyperlipidemia 05/24/2021   Essential hypertension 08/16/2016   Tension headache 02/26/2013   Insomnia 02/26/2013   Generalized anxiety disorder 02/26/2013    Social Hx   Social History   Socioeconomic History   Marital status: Married    Spouse name: Not on file   Number of children: Not on file   Years of education: Not on file   Highest education level: Not on file  Occupational History   Not on file  Tobacco Use   Smoking status: Never   Smokeless tobacco: Never  Substance and Sexual Activity   Alcohol use: No   Drug use: No   Sexual activity: Yes    Birth control/protection: None  Other Topics Concern   Not on file  Social History Narrative   Not on file   Social Drivers of Health   Financial Resource Strain: Not on file  Food Insecurity: Not on file  Transportation Needs: Not on file  Physical Activity: Not on file  Stress: Not on file  Social Connections: Not on file    Review of Systems Per  HPI  Objective:  BP 127/85   Pulse 86   Temp (!) 97.5 F (36.4 C)   Ht 5\' 1"  (1.549 m)   Wt 195 lb (88.5 kg)   SpO2 98%   BMI 36.84 kg/m      10/03/2023   10:08 AM 04/04/2023   10:11 AM 04/04/2023    9:52 AM  BP/Weight  Systolic BP 127 128 142  Diastolic BP 85 80 80  Wt. (Lbs) 195  192.8  BMI 36.84 kg/m2  36.43 kg/m2    Physical Exam Constitutional:      General: She is not in acute distress.    Appearance: Normal appearance.  HENT:     Head: Normocephalic and atraumatic.     Right Ear: Tympanic membrane normal.     Left Ear: Tympanic membrane normal.     Mouth/Throat:     Pharynx: Posterior oropharyngeal erythema present.  Cardiovascular:     Rate and Rhythm: Normal rate and regular rhythm.  Pulmonary:     Effort: Pulmonary effort is normal.     Breath sounds: Normal breath sounds. No wheezing or rales.  Neurological:     Mental Status: She is alert.     Lab Results  Component Value Date   WBC 6.8 08/31/2020   HGB 12.2 08/31/2020  HCT 37.0 08/31/2020   PLT 349 08/31/2020   GLUCOSE 95 08/31/2020   CHOL 194 08/31/2020   TRIG 77 08/31/2020   HDL 52 08/31/2020   LDLCALC 128 (H) 08/31/2020   ALT 21 08/31/2020   AST 17 08/31/2020   NA 141 08/31/2020   K 4.5 08/31/2020   CL 100 08/31/2020   CREATININE 0.58 08/31/2020   BUN 9 08/31/2020   CO2 24 08/31/2020   TSH 4.220 08/31/2020     Assessment & Plan:  Essential hypertension Assessment & Plan: Stable.  Continue current medications.  Orders: -     CMP14+EGFR  Sore throat Assessment & Plan: Rapid strep positive.  Placing on amoxicillin.  Nystatin as directed for vague oral pain.  Orders: -     POCT rapid strep A  Pure hypercholesterolemia -     Lipid panel  Screening for deficiency anemia -     CBC  Generalized anxiety disorder Assessment & Plan: Stable.  Continue current medications.   Other orders -     Nystatin; Take 5 mLs (500,000 Units total) by mouth 4 (four) times daily for 7  days. Swish in the mouth and retain for as long as possible before swallowing.  Dispense: 140 mL; Refill: 0 -     Cetirizine HCl; Take 1 tablet (10 mg total) by mouth daily.  Dispense: 30 tablet; Refill: 1 -     Amoxicillin; Take 1 capsule (500 mg total) by mouth 2 (two) times daily for 10 days.  Dispense: 20 capsule; Refill: 0    Follow-up:  Return in about 6 months (around 04/03/2024).  Everlene Other DO [facility]

## 2023-10-03 NOTE — Assessment & Plan Note (Signed)
 Rapid strep positive.  Placing on amoxicillin.  Nystatin as directed for vague oral pain.

## 2023-10-17 ENCOUNTER — Ambulatory Visit (HOSPITAL_COMMUNITY)
Admission: RE | Admit: 2023-10-17 | Discharge: 2023-10-17 | Disposition: A | Discharge status: Home / Self Care | Source: Ambulatory Visit | Attending: Family Medicine | Admitting: Family Medicine

## 2023-10-17 ENCOUNTER — Encounter (HOSPITAL_COMMUNITY): Payer: Self-pay

## 2023-10-17 DIAGNOSIS — Z1231 Encounter for screening mammogram for malignant neoplasm of breast: Secondary | ICD-10-CM | POA: Insufficient documentation

## 2024-04-03 ENCOUNTER — Ambulatory Visit: Admitting: Family Medicine

## 2024-04-07 ENCOUNTER — Ambulatory Visit: Payer: Self-pay | Admitting: Family Medicine

## 2024-04-07 ENCOUNTER — Telehealth: Payer: Self-pay | Admitting: *Deleted

## 2024-04-07 VITALS — BP 135/83 | Ht 61.0 in | Wt 198.4 lb

## 2024-04-07 DIAGNOSIS — Z130 Encounter for screening for diseases of the blood and blood-forming organs and certain disorders involving the immune mechanism: Secondary | ICD-10-CM | POA: Diagnosis not present

## 2024-04-07 DIAGNOSIS — I10 Essential (primary) hypertension: Secondary | ICD-10-CM | POA: Diagnosis not present

## 2024-04-07 DIAGNOSIS — E7800 Pure hypercholesterolemia, unspecified: Secondary | ICD-10-CM

## 2024-04-07 DIAGNOSIS — K219 Gastro-esophageal reflux disease without esophagitis: Secondary | ICD-10-CM | POA: Diagnosis not present

## 2024-04-07 DIAGNOSIS — F411 Generalized anxiety disorder: Secondary | ICD-10-CM | POA: Diagnosis not present

## 2024-04-07 MED ORDER — PANTOPRAZOLE SODIUM 40 MG PO TBEC: 40.0000 mg | DELAYED_RELEASE_TABLET | Freq: Two times a day (BID) | ORAL | 3 refills | Status: AC

## 2024-04-07 MED ORDER — SERTRALINE HCL 100 MG PO TABS
100.0000 mg | ORAL_TABLET | Freq: Every day | ORAL | 3 refills | Status: AC
Start: 2024-04-07 — End: ?

## 2024-04-07 MED ORDER — ENALAPRIL MALEATE 5 MG PO TABS: 5.0000 mg | ORAL_TABLET | Freq: Every day | ORAL | 3 refills | Status: AC

## 2024-04-07 MED ORDER — PANTOPRAZOLE SODIUM 40 MG PO TBEC: 40.0000 mg | DELAYED_RELEASE_TABLET | Freq: Two times a day (BID) | ORAL | 3 refills | Status: DC

## 2024-04-07 NOTE — Progress Notes (Signed)
 Subjective:  Patient ID: Martha Crawford, female    DOB: May 25, 1966  Age: 58 y.o. MRN: [id number]  CC:   Chief Complaint  Patient presents with   Hypertension    Follow up Problems with heartburn and stress    HPI:  58 year old female presents for follow-up.  Patient reports that she is having worsening heartburn despite using Protonix .  She states that she is having significant stressors as well.  No abdominal pain.  Will discuss today.  Blood pressure stable on enalapril .  Patient reports increase and stress.  She is on Zoloft  100 mg daily.  Patient declines immunizations today.  Patient amenable to labs today.  Patient Active Problem List   Diagnosis Date Noted   GERD (gastroesophageal reflux disease) 03/31/2022   Preventative health care 03/31/2022   Hyperlipidemia 05/24/2021   Essential hypertension 08/16/2016   Tension headache 02/26/2013   Insomnia 02/26/2013   Generalized anxiety disorder 02/26/2013    Social Hx   Social History   Socioeconomic History   Marital status: Married    Spouse name: Not on file   Number of children: Not on file   Years of education: Not on file   Highest education level: Not on file  Occupational History   Not on file  Tobacco Use   Smoking status: Never   Smokeless tobacco: Never  Substance and Sexual Activity   Alcohol use: No   Drug use: No   Sexual activity: Yes    Birth control/protection: None  Other Topics Concern   Not on file  Social History Narrative   Not on file   Social Drivers of Health   Financial Resource Strain: Not on file  Food Insecurity: Not on file  Transportation Needs: Not on file  Physical Activity: Not on file  Stress: Not on file  Social Connections: Not on file    Review of Systems Per HPI  Objective:  BP 135/83   Ht 5' 1 (1.549 m)   Wt 198 lb 6.4 oz (90 kg)   BMI 37.49 kg/m      04/07/2024    9:10 AM 10/03/2023   10:08 AM 04/04/2023   10:11 AM  BP/Weight  Systolic BP  135 872 128  Diastolic BP 83 85 80  Wt. (Lbs) 198.4 195   BMI 37.49 kg/m2 36.84 kg/m2     Physical Exam Vitals and nursing note reviewed.  Constitutional:      General: She is not in acute distress.    Appearance: Normal appearance.  HENT:     Head: Normocephalic and atraumatic.  Eyes:     General:        Right eye: No discharge.        Left eye: No discharge.     Conjunctiva/sclera: Conjunctivae normal.  Cardiovascular:     Rate and Rhythm: Normal rate and regular rhythm.  Pulmonary:     Effort: Pulmonary effort is normal.     Breath sounds: Normal breath sounds. No wheezing, rhonchi or rales.  Neurological:     Mental Status: She is alert.  Psychiatric:        Mood and Affect: Mood normal.        Behavior: Behavior normal.     Lab Results  Component Value Date   WBC 6.8 08/31/2020   HGB 12.2 08/31/2020   HCT 37.0 08/31/2020   PLT 349 08/31/2020   GLUCOSE 95 08/31/2020   CHOL 194 08/31/2020   TRIG 77 08/31/2020  HDL 52 08/31/2020   LDLCALC 128 (H) 08/31/2020   ALT 21 08/31/2020   AST 17 08/31/2020   NA 141 08/31/2020   K 4.5 08/31/2020   CL 100 08/31/2020   CREATININE 0.58 08/31/2020   BUN 9 08/31/2020   CO2 24 08/31/2020   TSH 4.220 08/31/2020     Assessment & Plan:  Essential hypertension Assessment & Plan: Stable.  Continue enalapril .  Orders: -     Enalapril  Maleate; Take 1 tablet (5 mg total) by mouth daily.  Dispense: 90 tablet; Refill: 3 -     CMP14+EGFR  Gastroesophageal reflux disease without esophagitis Assessment & Plan: Uncontrolled/worsening.  Increasing Protonix  to twice daily.  Referring to GI for endoscopy.  Orders: -     Ambulatory referral to Gastroenterology -     Pantoprazole  Sodium; Take 1 tablet (40 mg total) by mouth 2 (two) times daily before a meal.  Dispense: 180 tablet; Refill: 3  Generalized anxiety disorder Assessment & Plan: Continue Zoloft .  Orders: -     Sertraline  HCl; Take 1 tablet (100 mg total) by mouth  daily.  Dispense: 90 tablet; Refill: 3  Screening for deficiency anemia -     CBC  Pure hypercholesterolemia Assessment & Plan: Lipid panel today to assess.  Orders: -     Lipid panel    Follow-up:  6 months  Bryanah Sidell Bluford DO [facility]

## 2024-04-07 NOTE — Assessment & Plan Note (Signed)
 Uncontrolled/worsening.  Increasing Protonix  to twice daily.  Referring to GI for endoscopy.

## 2024-04-07 NOTE — Telephone Encounter (Signed)
 Copied from CRM [id number]. Topic: Clinical - Medication Question >> Apr 07, 2024 11:12 AM Treva T wrote: Received call from [facility], pharmacist with [facility], calling to inquire on specific medication instructions on how patient is supposed to take medication, Pantoprazole . States prescription has two different instructions listed.   Can be reached back at [phone]  Called and spoke to office, clinical staff unavailable at time of call, requested to send clinical CRM  Pharmacist aware of same day call back.

## 2024-04-07 NOTE — Assessment & Plan Note (Signed)
Stable.  Continue enalapril.

## 2024-04-07 NOTE — Assessment & Plan Note (Signed)
Lipid panel today to assess. 

## 2024-04-07 NOTE — Assessment & Plan Note (Signed)
 Continue Zoloft 

## 2024-04-07 NOTE — Telephone Encounter (Signed)
 Cook, Jayce G, DO      04/07/24 11:58 AM My apologies. Prescription fixed and sent in. She is to take BID.

## 2024-04-07 NOTE — Patient Instructions (Signed)
 Labs today.  Protonix  increased.  Try Nasacort.  Follow up in 6 months.

## 2024-04-08 ENCOUNTER — Ambulatory Visit: Payer: Self-pay | Admitting: Family Medicine

## 2024-04-08 ENCOUNTER — Encounter: Payer: Self-pay | Admitting: Internal Medicine

## 2024-04-08 LAB — CBC
Hematocrit: 37.5 % (ref 34.0–46.6)
Hemoglobin: 12 g/dL (ref 11.1–15.9)
MCH: 28.4 pg (ref 26.6–33.0)
MCHC: 32 g/dL (ref 31.5–35.7)
MCV: 89 fL (ref 79–97)
Platelets: 372 x10E3/uL (ref 150–450)
RBC: 4.22 x10E6/uL (ref 3.77–5.28)
RDW: 14.1 % (ref 11.7–15.4)
WBC: 6 x10E3/uL (ref 3.4–10.8)

## 2024-04-08 LAB — CMP14+EGFR
ALT: 23 IU/L (ref 0–32)
AST: 23 IU/L (ref 0–40)
Albumin: 4.2 g/dL (ref 3.8–4.9)
Alkaline Phosphatase: 117 IU/L (ref 49–135)
BUN/Creatinine Ratio: 16 (ref 9–23)
BUN: 9 mg/dL (ref 6–24)
Bilirubin Total: 0.3 mg/dL (ref 0.0–1.2)
CO2: 22 mmol/L (ref 20–29)
Calcium: 9.4 mg/dL (ref 8.7–10.2)
Chloride: 106 mmol/L (ref 96–106)
Creatinine, Ser: 0.56 mg/dL — ABNORMAL LOW (ref 0.57–1.00)
Globulin, Total: 3 g/dL (ref 1.5–4.5)
Glucose: 94 mg/dL (ref 70–99)
Potassium: 4.1 mmol/L (ref 3.5–5.2)
Sodium: 141 mmol/L (ref 134–144)
Total Protein: 7.2 g/dL (ref 6.0–8.5)
eGFR: 107 mL/min/1.73 (ref >59)

## 2024-04-08 LAB — LIPID PANEL
Chol/HDL Ratio: 4 ratio (ref 0.0–4.4)
Cholesterol, Total: 186 mg/dL (ref 100–199)
HDL: 47 mg/dL (ref >39)
LDL Chol Calc (NIH): 124 mg/dL — ABNORMAL HIGH (ref 0–99)
Triglycerides: 80 mg/dL (ref 0–149)
VLDL Cholesterol Cal: 15 mg/dL (ref 5–40)

## 2024-05-14 ENCOUNTER — Ambulatory Visit: Admitting: Internal Medicine

## 2024-05-15 ENCOUNTER — Other Ambulatory Visit: Payer: Self-pay | Admitting: Family Medicine

## 2024-05-15 ENCOUNTER — Other Ambulatory Visit: Payer: Self-pay

## 2024-05-15 DIAGNOSIS — G44209 Tension-type headache, unspecified, not intractable: Secondary | ICD-10-CM

## 2024-05-15 MED ORDER — BUTALBITAL-APAP-CAFFEINE 50-325-40 MG PO TABS: 1.0000 | ORAL_TABLET | Freq: Four times a day (QID) | ORAL | 3 refills | Status: AC | PRN
